# Patient Record
Sex: Male | Born: 1935
Health system: Southern US, Community
[De-identification: ages and names within clinical notes are randomized; demographics above are authoritative.]

## PROBLEM LIST (undated history)

## (undated) DIAGNOSIS — E785 Hyperlipidemia, unspecified: Secondary | ICD-10-CM

## (undated) DIAGNOSIS — H409 Unspecified glaucoma: Secondary | ICD-10-CM

## (undated) DIAGNOSIS — I10 Essential (primary) hypertension: Secondary | ICD-10-CM

## (undated) DIAGNOSIS — C801 Malignant (primary) neoplasm, unspecified: Secondary | ICD-10-CM

## (undated) HISTORY — PX: CATARACT EXTRACTION: SUR2

## (undated) HISTORY — DX: Unspecified glaucoma: H40.9

## (undated) HISTORY — PX: COLONOSCOPY: SHX174

## (undated) HISTORY — DX: Malignant (primary) neoplasm, unspecified: C80.1

## (undated) HISTORY — DX: Hyperlipidemia, unspecified: E78.5

---

## 1994-01-19 HISTORY — PX: PROSTATE SURGERY: SHX751

## 2003-08-31 ENCOUNTER — Other Ambulatory Visit: Payer: Self-pay

## 2005-05-21 ENCOUNTER — Ambulatory Visit: Payer: Self-pay | Admitting: Ophthalmology

## 2005-05-27 ENCOUNTER — Ambulatory Visit: Payer: Self-pay | Admitting: Ophthalmology

## 2005-12-14 ENCOUNTER — Ambulatory Visit: Payer: Self-pay | Admitting: General Surgery

## 2007-01-20 HISTORY — PX: COLON RESECTION: SHX5231

## 2008-09-14 ENCOUNTER — Ambulatory Visit: Payer: Self-pay | Admitting: General Surgery

## 2008-09-14 LAB — HM COLONOSCOPY: HM Colonoscopy: NORMAL

## 2011-07-06 ENCOUNTER — Ambulatory Visit: Payer: Self-pay | Admitting: Family Medicine

## 2011-08-15 ENCOUNTER — Ambulatory Visit: Payer: Self-pay | Admitting: Family Medicine

## 2011-11-17 ENCOUNTER — Ambulatory Visit: Payer: Self-pay | Admitting: Ophthalmology

## 2011-12-02 ENCOUNTER — Ambulatory Visit: Payer: Self-pay | Admitting: Ophthalmology

## 2012-04-19 ENCOUNTER — Ambulatory Visit: Payer: Self-pay | Admitting: Family Medicine

## 2012-08-02 ENCOUNTER — Encounter: Payer: Self-pay | Admitting: *Deleted

## 2012-10-24 DIAGNOSIS — Z8546 Personal history of malignant neoplasm of prostate: Secondary | ICD-10-CM | POA: Insufficient documentation

## 2013-04-25 LAB — HEPATIC FUNCTION PANEL
ALT: 13 U/L (ref 10–40)
AST: 16 U/L (ref 14–40)
Alkaline Phosphatase: 73 U/L (ref 25–125)
Bilirubin, Total: 0.8 mg/dL

## 2013-04-25 LAB — CBC AND DIFFERENTIAL
HCT: 49 % (ref 41–53)
Hemoglobin: 16.6 g/dL (ref 13.5–17.5)
Neutrophils Absolute: 3 /uL
PLATELETS: 298 10*3/uL (ref 150–399)
WBC: 5.9 10^3/mL

## 2013-04-25 LAB — TSH: TSH: 4.53 u[IU]/mL (ref 0.41–5.90)

## 2013-08-29 DIAGNOSIS — R339 Retention of urine, unspecified: Secondary | ICD-10-CM | POA: Insufficient documentation

## 2013-08-29 DIAGNOSIS — K4090 Unilateral inguinal hernia, without obstruction or gangrene, not specified as recurrent: Secondary | ICD-10-CM | POA: Insufficient documentation

## 2013-08-31 ENCOUNTER — Encounter: Payer: Self-pay | Admitting: General Surgery

## 2013-09-28 ENCOUNTER — Ambulatory Visit (INDEPENDENT_AMBULATORY_CARE_PROVIDER_SITE_OTHER): Payer: Commercial Managed Care - HMO | Admitting: General Surgery

## 2013-09-28 ENCOUNTER — Encounter: Payer: Self-pay | Admitting: General Surgery

## 2013-09-28 VITALS — BP 120/80 | HR 72 | Resp 14 | Ht 72.0 in | Wt 168.0 lb

## 2013-09-28 DIAGNOSIS — Z860100 Personal history of colon polyps, unspecified: Secondary | ICD-10-CM | POA: Insufficient documentation

## 2013-09-28 MED ORDER — POLYETHYLENE GLYCOL 3350 17 GM/SCOOP PO POWD: ORAL | Status: DC

## 2013-09-28 NOTE — Patient Instructions (Addendum)
Colonoscopy A colonoscopy is an exam to look at the entire large intestine (colon). This exam can help find problems such as tumors, polyps, inflammation, and areas of bleeding. The exam takes about 1 hour.  LET [facility] CARE PROVIDER KNOW ABOUT:   Any allergies you have.  All medicines you are taking, including vitamins, herbs, eye drops, creams, and over-the-counter medicines.  Previous problems you or members of your family have had with the use of anesthetics.  Any blood disorders you have.  Previous surgeries you have had.  Medical conditions you have. RISKS AND COMPLICATIONS  Generally, this is a safe procedure. However, as with any procedure, complications can occur. Possible complications include:  Bleeding.  Tearing or rupture of the colon wall.  Reaction to medicines given during the exam.  Infection (rare). BEFORE THE PROCEDURE   Ask your health care provider about changing or stopping your regular medicines.  You may be prescribed an oral bowel prep. This involves drinking a large amount of medicated liquid, starting the day before your procedure. The liquid will cause you to have multiple loose stools until your stool is almost clear or light green. This cleans out your colon in preparation for the procedure.  Do not eat or drink anything else once you have started the bowel prep, unless your health care provider tells you it is safe to do so.  Arrange for someone to drive you home after the procedure. PROCEDURE   You will be given medicine to help you relax (sedative).  You will lie on your side with your knees bent.  A long, flexible tube with a light and camera on the end (colonoscope) will be inserted through the rectum and into the colon. The camera sends video back to a computer screen as it moves through the colon. The colonoscope also releases carbon dioxide gas to inflate the colon. This helps your health care provider see the area better.  During  the exam, your health care provider may take a small tissue sample (biopsy) to be examined under a microscope if any abnormalities are found.  The exam is finished when the entire colon has been viewed. AFTER THE PROCEDURE   Do not drive for 24 hours after the exam.  You may have a small amount of blood in your stool.  You may pass moderate amounts of gas and have mild abdominal cramping or bloating. This is caused by the gas used to inflate your colon during the exam.  Ask when your test results will be ready and how you will get your results. Make sure you get your test results. Document Released: 01/03/2000 Document Revised: 10/26/2012 Document Reviewed: 09/12/2012 [facility] Patient Information 2015 [place], [place]. This information is not intended to replace advice given to you by your health care provider. Make sure you discuss any questions you have with your health care provider.  Patient has been scheduled for a colonoscopy on 10-11-13 at [facility].

## 2013-09-28 NOTE — Progress Notes (Signed)
Patient ID: Jeffrey Hendrix., male   DOB: 06/14/1924, 80 y.o.   MRN: [id number]  Chief Complaint  Patient presents with  . Colonoscopy    HPI Jeffrey Hendrix. is a 80 y.o. male.  Here today to discuss having a colonoscopy. Denies any gastrointestinal issues. Last colonoscopy was 09-14-08.In 2009 he had a right hemicolectomy for a large villous adenoma   HPI  Past Medical History  Diagnosis Date  . Hyperlipidemia   . Glaucoma   . Cancer     PROSTATE    Past Surgical History  Procedure Laterality Date  . Colon resection  2009  . Prostate surgery  1996  . Cataract extraction  2012, 2013  . Colonoscopy  09-14-08    Dr Jamal Collin    History reviewed. No pertinent family history.  Social History History  Substance Use Topics  . Smoking status: Never Smoker   . Smokeless tobacco: Never Used  . Alcohol Use: No    Allergies  Allergen Reactions  . Latex     Current Outpatient Prescriptions  Medication Sig Dispense Refill  . dorzolamide-timolol (COSOPT) 22.3-6.8 MG/ML ophthalmic solution 1 drop 2 (two) times daily.      Marland Kitchen levothyroxine (SYNTHROID, LEVOTHROID) 50 MCG tablet Take 50 mcg by mouth daily before breakfast.      . simvastatin (ZOCOR) 10 MG tablet Take 10 mg by mouth daily at 6 PM.       . polyethylene glycol powder (GLYCOLAX/MIRALAX) powder 255 grams one bottle for colonoscopy prep  255 g  0   No current facility-administered medications for this visit.    Review of Systems Review of Systems  Constitutional: Negative.   Respiratory: Negative.   Cardiovascular: Negative.   Gastrointestinal: Negative for diarrhea, constipation and blood in stool.    Blood pressure 120/80, pulse 72, resp. rate 14, height 6' (1.829 m), weight 168 lb (76.204 kg).  Physical Exam Physical Exam  Constitutional: He is oriented to person, place, and time. He appears well-developed and well-nourished.  Eyes: Conjunctivae are normal. No scleral icterus.  Neck: Neck supple.   Cardiovascular: Normal rate, regular rhythm and normal heart sounds.   Pulmonary/Chest: Effort normal and breath sounds normal.  Abdominal: Soft. Normal appearance and bowel sounds are normal. There is no hepatomegaly. There is no tenderness. No hernia.    Neurological: He is alert and oriented to person, place, and time.  Skin: Skin is warm and dry.    Data Reviewed Prior notes  Assessment    History of colon polyps. Right groin nodule-can be followed    Plan    Discussed colonoscopy for surveillance. Patient agreeable.     Patient has been scheduled for a colonoscopy on 10-11-13 at [facility]   SANKAR,SEEPLAPUTHUR G 09/28/2013, 1:51 PM

## 2013-10-06 ENCOUNTER — Other Ambulatory Visit: Payer: Self-pay | Admitting: General Surgery

## 2013-10-06 DIAGNOSIS — Z86010 Personal history of colonic polyps: Secondary | ICD-10-CM

## 2013-10-11 ENCOUNTER — Ambulatory Visit: Payer: Self-pay | Admitting: General Surgery

## 2013-10-11 DIAGNOSIS — K620 Anal polyp: Secondary | ICD-10-CM

## 2013-10-11 DIAGNOSIS — K621 Rectal polyp: Secondary | ICD-10-CM

## 2013-10-11 DIAGNOSIS — Z86010 Personal history of colonic polyps: Secondary | ICD-10-CM

## 2013-10-12 ENCOUNTER — Encounter: Payer: Self-pay | Admitting: General Surgery

## 2013-10-13 LAB — PATHOLOGY REPORT

## 2013-10-16 ENCOUNTER — Encounter: Payer: Self-pay | Admitting: General Surgery

## 2013-11-09 LAB — LIPID PANEL
CHOLESTEROL: 160 mg/dL (ref 0–200)
HDL: 54 mg/dL (ref 35–70)
LDL CALC: 77 mg/dL
LDL/HDL RATIO: 1.4
TRIGLYCERIDES: 146 mg/dL (ref 40–160)

## 2014-02-14 ENCOUNTER — Encounter: Payer: Self-pay | Admitting: General Surgery

## 2014-02-14 ENCOUNTER — Ambulatory Visit (INDEPENDENT_AMBULATORY_CARE_PROVIDER_SITE_OTHER): Payer: Commercial Managed Care - HMO | Admitting: General Surgery

## 2014-02-14 VITALS — BP 164/88 | HR 82 | Resp 14 | Ht 72.0 in | Wt 172.0 lb

## 2014-02-14 DIAGNOSIS — R109 Unspecified abdominal pain: Secondary | ICD-10-CM | POA: Diagnosis not present

## 2014-02-14 NOTE — Progress Notes (Signed)
Patient ID: Jeffrey Eve., male   DOB: March 31, 1924, 81 y.o.   MRN: [id number]  Chief Complaint  Patient presents with  . Follow-up    left side ache    HPI Jeffrey Hendrix. is a 82 y.o. male here today for a evaluation of left side abdominal pain.He states the pain is a mild discomfort and occurs at night when lying down. He does not have any abdominal symptoms during wake ours.  Patient states he had a colonoscopy in 09/2013 and a rectal polyp was removed.   HPI  Past Medical History  Diagnosis Date  . Hyperlipidemia   . Glaucoma   . Cancer     PROSTATE    Past Surgical History  Procedure Laterality Date  . Colon resection  2009  . Prostate surgery  1996  . Cataract extraction  2012, 2013  . Colonoscopy  09-14-08,09/2013.    Dr Jamal Collin    History reviewed. No pertinent family history.  Social History History  Substance Use Topics  . Smoking status: Never Smoker   . Smokeless tobacco: Never Used  . Alcohol Use: No    Allergies  Allergen Reactions  . Latex     Current Outpatient Prescriptions  Medication Sig Dispense Refill  . dorzolamide-timolol (COSOPT) 22.3-6.8 MG/ML ophthalmic solution 1 drop 2 (two) times daily.    Marland Kitchen levothyroxine (SYNTHROID, LEVOTHROID) 50 MCG tablet Take 50 mcg by mouth daily before breakfast.    . polyethylene glycol powder (GLYCOLAX/MIRALAX) powder 255 grams one bottle for colonoscopy prep 255 g 0  . simvastatin (ZOCOR) 10 MG tablet Take 10 mg by mouth daily at 6 PM.      No current facility-administered medications for this visit.    Review of Systems Review of Systems  Blood pressure 164/88, pulse 82, resp. rate 14, height 6' (1.829 m), weight 172 lb (78.019 kg).  Physical Exam Physical Exam  Constitutional: He is oriented to person, place, and time. He appears well-developed and well-nourished.  Abdominal: Soft. Normal appearance and bowel sounds are normal. There is no hepatomegaly. There is no tenderness. No  hernia.  He still has a small firm nodular area in right spermatic cord below level of external ring. This is not a hernia.  Neurological: He is alert and oriented to person, place, and time.  Skin: Skin is warm and dry.    Data Reviewed None Assessment    Abd discomfort is likely not from GI source. If it worsens will evaluate  With CT.    Plan    Patient in recall for his Flex Sig in March 2016.        Jeffrey Hendrix G 02/14/2014, 2:21 PM

## 2014-02-14 NOTE — Patient Instructions (Signed)
  Patient in recall for his Flex Sig in March 2016.

## 2014-03-05 DIAGNOSIS — N50819 Testicular pain, unspecified: Secondary | ICD-10-CM | POA: Insufficient documentation

## 2014-03-07 DIAGNOSIS — L820 Inflamed seborrheic keratosis: Secondary | ICD-10-CM | POA: Diagnosis not present

## 2014-03-07 DIAGNOSIS — L821 Other seborrheic keratosis: Secondary | ICD-10-CM | POA: Diagnosis not present

## 2014-03-07 DIAGNOSIS — L570 Actinic keratosis: Secondary | ICD-10-CM | POA: Diagnosis not present

## 2014-03-07 DIAGNOSIS — L538 Other specified erythematous conditions: Secondary | ICD-10-CM | POA: Diagnosis not present

## 2014-03-07 DIAGNOSIS — Z85828 Personal history of other malignant neoplasm of skin: Secondary | ICD-10-CM | POA: Diagnosis not present

## 2014-03-07 DIAGNOSIS — X32XXXA Exposure to sunlight, initial encounter: Secondary | ICD-10-CM | POA: Diagnosis not present

## 2014-04-11 ENCOUNTER — Ambulatory Visit (INDEPENDENT_AMBULATORY_CARE_PROVIDER_SITE_OTHER): Payer: Commercial Managed Care - HMO | Admitting: General Surgery

## 2014-04-11 ENCOUNTER — Encounter: Payer: Self-pay | Admitting: General Surgery

## 2014-04-11 VITALS — BP 140/70 | HR 78 | Resp 12 | Ht 72.0 in | Wt 168.0 lb

## 2014-04-11 DIAGNOSIS — D012 Carcinoma in situ of rectum: Secondary | ICD-10-CM

## 2014-04-11 NOTE — Patient Instructions (Signed)
Patient will follow up in September for colonoscopy.

## 2014-04-11 NOTE — Progress Notes (Signed)
Here today for 6 month follow up rigid sigmoidoscopy. No bowel issues and no blood noted.   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  Patient was placed in left lateral position.  Rigid sigmoidoscope was introduced into the rectum and advanced at 20 cm level. There was good visualization and no polyps or other abnormalities noted. Plan for colonoscopy in September which will be 1 year from the initial study.

## 2014-04-19 DIAGNOSIS — N508 Other specified disorders of male genital organs: Secondary | ICD-10-CM | POA: Diagnosis not present

## 2014-04-19 DIAGNOSIS — H409 Unspecified glaucoma: Secondary | ICD-10-CM | POA: Insufficient documentation

## 2014-04-19 DIAGNOSIS — K4090 Unilateral inguinal hernia, without obstruction or gangrene, not specified as recurrent: Secondary | ICD-10-CM | POA: Diagnosis not present

## 2014-04-19 DIAGNOSIS — Z8546 Personal history of malignant neoplasm of prostate: Secondary | ICD-10-CM | POA: Diagnosis not present

## 2014-04-25 ENCOUNTER — Emergency Department
Admit: 2014-04-25 | Disposition: A | Discharge status: Home / Self Care | Payer: Self-pay | Admitting: Emergency Medicine

## 2014-04-25 DIAGNOSIS — I10 Essential (primary) hypertension: Secondary | ICD-10-CM | POA: Diagnosis not present

## 2014-04-25 DIAGNOSIS — R0789 Other chest pain: Secondary | ICD-10-CM | POA: Diagnosis not present

## 2014-04-25 LAB — CBC
HCT: 50.2 % (ref 40.0–52.0)
HGB: 16.6 g/dL (ref 13.0–18.0)
MCH: 30.4 pg (ref 26.0–34.0)
MCHC: 33 g/dL (ref 32.0–36.0)
MCV: 92 fL (ref 80–100)
Platelet: 269 10*3/uL (ref 150–440)
RBC: 5.46 10*6/uL (ref 4.40–5.90)
RDW: 14.1 % (ref 11.5–14.5)
WBC: 7 10*3/uL (ref 3.8–10.6)

## 2014-04-25 LAB — BASIC METABOLIC PANEL
Anion Gap: 6 — ABNORMAL LOW (ref 7–16)
BUN: 16 mg/dL
Calcium, Total: 9.5 mg/dL
Chloride: 103 mmol/L
Co2: 29 mmol/L
Creatinine: 1.03 mg/dL
EGFR (Non-African Amer.): >60
Glucose: 96 mg/dL
Potassium: 4 mmol/L
Sodium: 138 mmol/L

## 2014-04-25 LAB — TROPONIN I

## 2014-04-26 DIAGNOSIS — R0789 Other chest pain: Secondary | ICD-10-CM | POA: Diagnosis not present

## 2014-04-26 DIAGNOSIS — I10 Essential (primary) hypertension: Secondary | ICD-10-CM | POA: Diagnosis not present

## 2014-04-30 DIAGNOSIS — C61 Malignant neoplasm of prostate: Secondary | ICD-10-CM | POA: Diagnosis not present

## 2014-04-30 DIAGNOSIS — Z23 Encounter for immunization: Secondary | ICD-10-CM | POA: Diagnosis not present

## 2014-04-30 DIAGNOSIS — E039 Hypothyroidism, unspecified: Secondary | ICD-10-CM | POA: Diagnosis not present

## 2014-04-30 DIAGNOSIS — R5381 Other malaise: Secondary | ICD-10-CM | POA: Diagnosis not present

## 2014-04-30 DIAGNOSIS — I10 Essential (primary) hypertension: Secondary | ICD-10-CM | POA: Diagnosis not present

## 2014-05-07 DIAGNOSIS — Z23 Encounter for immunization: Secondary | ICD-10-CM | POA: Diagnosis not present

## 2014-05-07 DIAGNOSIS — C61 Malignant neoplasm of prostate: Secondary | ICD-10-CM | POA: Diagnosis not present

## 2014-05-07 DIAGNOSIS — I10 Essential (primary) hypertension: Secondary | ICD-10-CM | POA: Diagnosis not present

## 2014-05-07 DIAGNOSIS — E039 Hypothyroidism, unspecified: Secondary | ICD-10-CM | POA: Diagnosis not present

## 2014-05-07 DIAGNOSIS — R5381 Other malaise: Secondary | ICD-10-CM | POA: Diagnosis not present

## 2014-05-08 DIAGNOSIS — H4011X3 Primary open-angle glaucoma, severe stage: Secondary | ICD-10-CM | POA: Diagnosis not present

## 2014-05-08 NOTE — Op Note (Signed)
PATIENT NAME:  Jeffrey Hendrix, Jeffrey Hendrix MR#:  [id number] DATE OF BIRTH:  Jan 11, 1925  DATE OF PROCEDURE:  12/02/2011  PREOPERATIVE DIAGNOSIS:  Senile cataract right eye.  POSTOPERATIVE DIAGNOSIS:  Senile cataract right eye.  PROCEDURE:  Phacoemulsification with posterior chamber intraocular lens implantation of the right eye.  LENS:  ZCB00 19-diopter posterior chamber intraocular lens.  ULTRASOUND TIME:  16 % of 1 minute, 19 seconds. CDE 12.2.  SURGEON:  Mali Tamie Minteer, MD  ANESTHESIA:  Topical with tetracaine drops and 2% Xylocaine jelly.  COMPLICATIONS:  None.  DESCRIPTION OF PROCEDURE:  The patient was identified in the holding room and transported to the operating room and placed in the supine position under the operating microscope.  The right eye was identified as the operative eye and it was prepped and draped in the usual sterile ophthalmic fashion.  A 1 millimeter clear-corneal paracentesis was made at the 12:00 position.  The anterior chamber was filled with Viscoat viscoelastic.  A 2.4 millimeter keratome was used to make a near-clear corneal incision at the 9:00 position.  A curvilinear capsulorrhexis was made with a cystotome and capsulorrhexis forceps.  Balanced salt solution was used to hydrodissect and hydrodelineate the nucleus.  Phacoemulsification was then used in horizontal chopping fashion to remove the lens nucleus and epinucleus.  The remaining cortex was then removed using the irrigation and aspiration handpiece. Provisc was then placed into the capsular bag to distend it for lens placement.  A ZCB00 19-diopter lens was then injected into the capsular bag.  The remaining viscoelastic was aspirated.  Wounds were hydrated with balanced salt solution.  The anterior chamber was inflated to a physiologic pressure with balanced salt solution.  Miostat was placed into the anterior chamber to constrict the pupil.  No wound leaks were noted.  Topical Vigamox drops and Maxitrol ointment  were applied to the eye. The patient was taken to the recovery room in stable condition without complications of anesthesia or surgery.   ____________________________ Wyonia Hough, MD crb:bjt D: 12/02/2011 15:07:45 ET T: 12/02/2011 15:57:48 ET JOB#: [id number]  cc: Wyonia Hough, MD, <Dictator> Leandrew Koyanagi MD ELECTRONICALLY SIGNED 12/08/2011 12:23

## 2014-06-04 DIAGNOSIS — E039 Hypothyroidism, unspecified: Secondary | ICD-10-CM | POA: Diagnosis not present

## 2014-06-04 DIAGNOSIS — Z23 Encounter for immunization: Secondary | ICD-10-CM | POA: Diagnosis not present

## 2014-06-04 DIAGNOSIS — I10 Essential (primary) hypertension: Secondary | ICD-10-CM | POA: Diagnosis not present

## 2014-06-04 DIAGNOSIS — C61 Malignant neoplasm of prostate: Secondary | ICD-10-CM | POA: Diagnosis not present

## 2014-06-04 DIAGNOSIS — E780 Pure hypercholesterolemia: Secondary | ICD-10-CM | POA: Diagnosis not present

## 2014-06-04 LAB — BASIC METABOLIC PANEL
BUN: 14 mg/dL (ref 4–21)
Creatinine: 1.1 mg/dL (ref 0.6–1.3)
Glucose: 96 mg/dL
Potassium: 4.6 mmol/L (ref 3.4–5.3)
Sodium: 141 mmol/L (ref 137–147)

## 2014-09-12 DIAGNOSIS — X32XXXA Exposure to sunlight, initial encounter: Secondary | ICD-10-CM | POA: Diagnosis not present

## 2014-09-12 DIAGNOSIS — D485 Neoplasm of uncertain behavior of skin: Secondary | ICD-10-CM | POA: Diagnosis not present

## 2014-09-12 DIAGNOSIS — D2372 Other benign neoplasm of skin of left lower limb, including hip: Secondary | ICD-10-CM | POA: Diagnosis not present

## 2014-09-12 DIAGNOSIS — R58 Hemorrhage, not elsewhere classified: Secondary | ICD-10-CM | POA: Diagnosis not present

## 2014-09-12 DIAGNOSIS — L570 Actinic keratosis: Secondary | ICD-10-CM | POA: Diagnosis not present

## 2014-09-12 DIAGNOSIS — L821 Other seborrheic keratosis: Secondary | ICD-10-CM | POA: Diagnosis not present

## 2014-10-16 ENCOUNTER — Encounter: Payer: Self-pay | Admitting: General Surgery

## 2014-10-16 ENCOUNTER — Ambulatory Visit (INDEPENDENT_AMBULATORY_CARE_PROVIDER_SITE_OTHER): Payer: Commercial Managed Care - HMO | Admitting: General Surgery

## 2014-10-16 VITALS — BP 122/70 | HR 74 | Resp 14 | Ht 72.0 in | Wt 171.0 lb

## 2014-10-16 DIAGNOSIS — Z86010 Personal history of colonic polyps: Secondary | ICD-10-CM | POA: Diagnosis not present

## 2014-10-16 DIAGNOSIS — D012 Carcinoma in situ of rectum: Secondary | ICD-10-CM | POA: Diagnosis not present

## 2014-10-16 MED ORDER — POLYETHYLENE GLYCOL 3350 17 GM/SCOOP PO POWD: 1.0000 | Freq: Once | ORAL | Status: DC

## 2014-10-16 NOTE — Patient Instructions (Addendum)
Colonoscopy A colonoscopy is an exam to look at the entire large intestine (colon). This exam can help find problems such as tumors, polyps, inflammation, and areas of bleeding. The exam takes about 1 hour.  LET [facility] CARE PROVIDER KNOW ABOUT:   Any allergies you have.  All medicines you are taking, including vitamins, herbs, eye drops, creams, and over-the-counter medicines.  Previous problems you or members of your family have had with the use of anesthetics.  Any blood disorders you have.  Previous surgeries you have had.  Medical conditions you have. RISKS AND COMPLICATIONS  Generally, this is a safe procedure. However, as with any procedure, complications can occur. Possible complications include:  Bleeding.  Tearing or rupture of the colon wall.  Reaction to medicines given during the exam.  Infection (rare). BEFORE THE PROCEDURE   Ask your health care provider about changing or stopping your regular medicines.  You may be prescribed an oral bowel prep. This involves drinking a large amount of medicated liquid, starting the day before your procedure. The liquid will cause you to have multiple loose stools until your stool is almost clear or light green. This cleans out your colon in preparation for the procedure.  Do not eat or drink anything else once you have started the bowel prep, unless your health care provider tells you it is safe to do so.  Arrange for someone to drive you home after the procedure. PROCEDURE   You will be given medicine to help you relax (sedative).  You will lie on your side with your knees bent.  A long, flexible tube with a light and camera on the end (colonoscope) will be inserted through the rectum and into the colon. The camera sends video back to a computer screen as it moves through the colon. The colonoscope also releases carbon dioxide gas to inflate the colon. This helps your health care provider see the area better.  During  the exam, your health care provider may take a small tissue sample (biopsy) to be examined under a microscope if any abnormalities are found.  The exam is finished when the entire colon has been viewed. AFTER THE PROCEDURE   Do not drive for 24 hours after the exam.  You may have a small amount of blood in your stool.  You may pass moderate amounts of gas and have mild abdominal cramping or bloating. This is caused by the gas used to inflate your colon during the exam.  Ask when your test results will be ready and how you will get your results. Make sure you get your test results. Document Released: 01/03/2000 Document Revised: 10/26/2012 Document Reviewed: 09/12/2012 [facility] Patient Information 2015 [place], [place]. This information is not intended to replace advice given to you by your health care provider. Make sure you discuss any questions you have with your health care provider.  Patient is scheduled for a colonoscopy at [facility] on 10/31/14. He is aware to call the day before for his arrival time. Miralax prescription has been sent into his pharmacy. The patient is aware of date and instructions.

## 2014-10-16 NOTE — Progress Notes (Signed)
Patient ID: Jeffrey Eve., male   DOB: Dec 08, 1924, 81 y.o.   MRN: [id number]  Chief Complaint  Patient presents with  . Follow-up    rectal cancer    HPI Jeffrey Hendrix. is a 81 y.o. male here today follow up rectal cancer -intramucosal carcinoma in a rectal polyp removed 1 year ago via colonoscope. In 2009 he had a right hemicolonectomy for a large villous adenoma. Patient states he is doing well. He reports a "sick or icky feeling" in his abdomen for the past 6-8 weeks. Recently it has been more focused in the LLQ. It did ease off a bit the past 2-3 days. He takes a thyroid pill in the morning. He had tried drinking lactose-free milk but 2 days ago resumed drinking 2% milk.    HPI  Past Medical History  Diagnosis Date  . Hyperlipidemia   . Glaucoma   . Cancer     PROSTATE    Past Surgical History  Procedure Laterality Date  . Colon resection  2009  . Prostate surgery  1996  . Cataract extraction  2012, 2013  . Colonoscopy  09-14-08,09/2013.    Dr Jamal Collin    History reviewed. No pertinent family history.  Social History Social History  Substance Use Topics  . Smoking status: Never Smoker   . Smokeless tobacco: Never Used  . Alcohol Use: No    Allergies  Allergen Reactions  . Latex     Current Outpatient Prescriptions  Medication Sig Dispense Refill  . hydrochlorothiazide (MICROZIDE) 12.5 MG capsule Take 12.5 mg by mouth daily.    Marland Kitchen levothyroxine (SYNTHROID, LEVOTHROID) 50 MCG tablet Take 50 mcg by mouth daily before breakfast.    . simvastatin (ZOCOR) 10 MG tablet Take 10 mg by mouth daily at 6 PM.     . timolol (TIMOPTIC) 0.5 % ophthalmic solution     . polyethylene glycol powder (GLYCOLAX/MIRALAX) powder Take 255 g by mouth once. 255 g 0   No current facility-administered medications for this visit.    Review of Systems Review of Systems  Constitutional: Negative.   Respiratory: Negative.   Cardiovascular: Negative.     Blood pressure  122/70, pulse 74, resp. rate 14, height 6' (1.829 m), weight 171 lb (77.565 kg).  Physical Exam Physical Exam  Constitutional: He is oriented to person, place, and time. He appears well-developed and well-nourished.  Eyes: Conjunctivae are normal. No scleral icterus.  Neck: Neck supple.  Cardiovascular: Normal rate, regular rhythm and normal heart sounds.   Pulmonary/Chest: Effort normal and breath sounds normal.  Abdominal: Soft. Normal appearance and bowel sounds are normal. There is no hepatomegaly. There is no tenderness.  Lymphadenopathy:    He has no cervical adenopathy.  Neurological: He is alert and oriented to person, place, and time.  Skin: Skin is warm and dry.    Data Reviewed Notes reviewed.  Assessment    History of colonic polyp with intramucosal cancer Abdominal discomfort due to diet?, improving    Plan   Surveillance colonoscopy as planned. Colonoscopy with possible biopsy/polypectomy prn: Information regarding the procedure, including its potential risks and complications (including but not limited to perforation of the bowel, which may require emergency surgery to repair, and bleeding) was verbally given to the patient. Educational information regarding lower intestinal endoscopy was given to the patient. Written instructions for how to complete the bowel prep using Miralax were provided. The importance of drinking ample fluids to avoid dehydration as a result of  the prep emphasized.  Patient is scheduled for a colonoscopy at [facility] on 10/31/14. He is aware to call the day before for his arrival time. Miralax prescription has been sent into his pharmacy. The patient is aware of date and instructions.      PCP:  Cranford Mon, Richard Barkley Bruns G 10/16/2014, 10:11 AM

## 2014-10-24 DIAGNOSIS — H401133 Primary open-angle glaucoma, bilateral, severe stage: Secondary | ICD-10-CM | POA: Diagnosis not present

## 2014-10-29 DIAGNOSIS — H401133 Primary open-angle glaucoma, bilateral, severe stage: Secondary | ICD-10-CM | POA: Diagnosis not present

## 2014-10-31 ENCOUNTER — Ambulatory Visit: Payer: Commercial Managed Care - HMO | Admitting: Anesthesiology

## 2014-10-31 ENCOUNTER — Ambulatory Visit
Admission: RE | Admit: 2014-10-31 | Discharge: 2014-10-31 | Disposition: A | Discharge status: Home / Self Care | Payer: Commercial Managed Care - HMO | Source: Ambulatory Visit | Attending: General Surgery | Admitting: General Surgery

## 2014-10-31 ENCOUNTER — Encounter
Admission: RE | Disposition: A | Discharge status: Home / Self Care | Payer: Self-pay | Source: Ambulatory Visit | Attending: General Surgery

## 2014-10-31 ENCOUNTER — Encounter: Payer: Self-pay | Admitting: *Deleted

## 2014-10-31 DIAGNOSIS — Z91040 Latex allergy status: Secondary | ICD-10-CM | POA: Diagnosis not present

## 2014-10-31 DIAGNOSIS — E785 Hyperlipidemia, unspecified: Secondary | ICD-10-CM | POA: Insufficient documentation

## 2014-10-31 DIAGNOSIS — Z9849 Cataract extraction status, unspecified eye: Secondary | ICD-10-CM | POA: Insufficient documentation

## 2014-10-31 DIAGNOSIS — K5790 Diverticulosis of intestine, part unspecified, without perforation or abscess without bleeding: Secondary | ICD-10-CM | POA: Diagnosis not present

## 2014-10-31 DIAGNOSIS — Z86010 Personal history of colonic polyps: Secondary | ICD-10-CM | POA: Insufficient documentation

## 2014-10-31 DIAGNOSIS — Z85038 Personal history of other malignant neoplasm of large intestine: Secondary | ICD-10-CM | POA: Diagnosis not present

## 2014-10-31 DIAGNOSIS — Z9049 Acquired absence of other specified parts of digestive tract: Secondary | ICD-10-CM | POA: Insufficient documentation

## 2014-10-31 DIAGNOSIS — Z09 Encounter for follow-up examination after completed treatment for conditions other than malignant neoplasm: Secondary | ICD-10-CM | POA: Diagnosis not present

## 2014-10-31 DIAGNOSIS — Z8546 Personal history of malignant neoplasm of prostate: Secondary | ICD-10-CM | POA: Diagnosis not present

## 2014-10-31 DIAGNOSIS — H409 Unspecified glaucoma: Secondary | ICD-10-CM | POA: Diagnosis not present

## 2014-10-31 DIAGNOSIS — Z79899 Other long term (current) drug therapy: Secondary | ICD-10-CM | POA: Insufficient documentation

## 2014-10-31 HISTORY — PX: COLONOSCOPY WITH PROPOFOL: SHX5780

## 2014-10-31 SURGERY — COLONOSCOPY WITH PROPOFOL
Anesthesia: General

## 2014-10-31 MED ORDER — PROPOFOL 500 MG/50ML IV EMUL
INTRAVENOUS | Status: DC | PRN
  Administered 2014-10-31: 125 ug/kg/min via INTRAVENOUS

## 2014-10-31 MED ORDER — PROPOFOL 10 MG/ML IV BOLUS
INTRAVENOUS | Status: DC | PRN
  Administered 2014-10-31: 40 mg via INTRAVENOUS

## 2014-10-31 MED ORDER — LIDOCAINE HCL (CARDIAC) 20 MG/ML IV SOLN
INTRAVENOUS | Status: DC | PRN
  Administered 2014-10-31: 60 mg via INTRAVENOUS

## 2014-10-31 MED ORDER — SODIUM CHLORIDE 0.9 % IV SOLN
INTRAVENOUS | Status: DC
  Administered 2014-10-31: 1000 mL via INTRAVENOUS

## 2014-10-31 NOTE — Anesthesia Preprocedure Evaluation (Signed)
Anesthesia Evaluation  Patient identified by MRN, date of birth, ID band Patient awake    Reviewed: Allergy & Precautions, H&P , NPO status , Patient's Chart, lab work & pertinent test results  History of Anesthesia Complications Negative for: history of anesthetic complications  Airway Mallampati: II  TM Distance: >3 FB Neck ROM: limited    Dental no notable dental hx. (+) Teeth Intact   Pulmonary neg pulmonary ROS, neg shortness of breath,    Pulmonary exam normal breath sounds clear to auscultation       Cardiovascular Exercise Tolerance: Good (-) angina(-) Past MI and (-) DOE negative cardio ROS Normal cardiovascular exam Rhythm:regular Rate:Normal     Neuro/Psych negative neurological ROS  negative psych ROS   GI/Hepatic negative GI ROS, Neg liver ROS,   Endo/Other  negative endocrine ROS  Renal/GU negative Renal ROS  negative genitourinary   Musculoskeletal   Abdominal   Peds  Hematology negative hematology ROS (+)   Anesthesia Other Findings Past Medical History:   Hyperlipidemia                                               Glaucoma                                                     Cancer (HCC)                                                   Comment:PROSTATE  BMI    Body Mass Index   23.59 kg/m 2      Reproductive/Obstetrics negative OB ROS                             Anesthesia Physical Anesthesia Plan  ASA: II  Anesthesia Plan: General   Post-op Pain Management:    Induction:   Airway Management Planned:   Additional Equipment:   Intra-op Plan:   Post-operative Plan:   Informed Consent: I have reviewed the patients History and Physical, chart, labs and discussed the procedure including the risks, benefits and alternatives for the proposed anesthesia with the patient or authorized representative who has indicated his/her understanding and acceptance.    Dental Advisory Given  Plan Discussed with: Anesthesiologist, CRNA and Surgeon  Anesthesia Plan Comments:         Anesthesia Quick Evaluation

## 2014-10-31 NOTE — H&P (View-Only) (Signed)
Patient ID: Jeffrey Eve., male   DOB: [birth year], 81 y.o.   MRN: [id number]  Chief Complaint  Patient presents with  . Follow-up    rectal cancer    HPI Jeffrey Hendrix. is a 81 y.o. male here today follow up rectal cancer -intramucosal carcinoma in a rectal polyp removed 1 year ago via colonoscope. In 2009 he had a right hemicolonectomy for a large villous adenoma. Patient states he is doing well. He reports a "sick or icky feeling" in his abdomen for the past 6-8 weeks. Recently it has been more focused in the LLQ. It did ease off a bit the past 2-3 days. He takes a thyroid pill in the morning. He had tried drinking lactose-free milk but 2 days ago resumed drinking 2% milk.    HPI  Past Medical History  Diagnosis Date  . Hyperlipidemia   . Glaucoma   . Cancer     PROSTATE    Past Surgical History  Procedure Laterality Date  . Colon resection  2009  . Prostate surgery  1996  . Cataract extraction  2012, 2013  . Colonoscopy  09-14-08,09/2013.    Dr Jamal Collin    History reviewed. No pertinent family history.  Social History Social History  Substance Use Topics  . Smoking status: Never Smoker   . Smokeless tobacco: Never Used  . Alcohol Use: No    Allergies  Allergen Reactions  . Latex     Current Outpatient Prescriptions  Medication Sig Dispense Refill  . hydrochlorothiazide (MICROZIDE) 12.5 MG capsule Take 12.5 mg by mouth daily.    Marland Kitchen levothyroxine (SYNTHROID, LEVOTHROID) 50 MCG tablet Take 50 mcg by mouth daily before breakfast.    . simvastatin (ZOCOR) 10 MG tablet Take 10 mg by mouth daily at 6 PM.     . timolol (TIMOPTIC) 0.5 % ophthalmic solution     . polyethylene glycol powder (GLYCOLAX/MIRALAX) powder Take 255 g by mouth once. 255 g 0   No current facility-administered medications for this visit.    Review of Systems Review of Systems  Constitutional: Negative.   Respiratory: Negative.   Cardiovascular: Negative.     Blood pressure  122/70, pulse 74, resp. rate 14, height 6' (1.829 m), weight 171 lb (77.565 kg).  Physical Exam Physical Exam  Constitutional: He is oriented to person, place, and time. He appears well-developed and well-nourished.  Eyes: Conjunctivae are normal. No scleral icterus.  Neck: Neck supple.  Cardiovascular: Normal rate, regular rhythm and normal heart sounds.   Pulmonary/Chest: Effort normal and breath sounds normal.  Abdominal: Soft. Normal appearance and bowel sounds are normal. There is no hepatomegaly. There is no tenderness.  Lymphadenopathy:    He has no cervical adenopathy.  Neurological: He is alert and oriented to person, place, and time.  Skin: Skin is warm and dry.    Data Reviewed Notes reviewed.  Assessment    History of colonic polyp with intramucosal cancer Abdominal discomfort due to diet?, improving    Plan   Surveillance colonoscopy as planned. Colonoscopy with possible biopsy/polypectomy prn: Information regarding the procedure, including its potential risks and complications (including but not limited to perforation of the bowel, which may require emergency surgery to repair, and bleeding) was verbally given to the patient. Educational information regarding lower intestinal endoscopy was given to the patient. Written instructions for how to complete the bowel prep using Miralax were provided. The importance of drinking ample fluids to avoid dehydration as a result of  the prep emphasized.  Patient is scheduled for a colonoscopy at [facility] on 10/31/14. He is aware to call the day before for his arrival time. Miralax prescription has been sent into his pharmacy. The patient is aware of date and instructions.      PCP:  Cranford Mon, Richard Barkley Bruns G 10/16/2014, 10:11 AM

## 2014-10-31 NOTE — Transfer of Care (Signed)
Immediate Anesthesia Transfer of Care Note  Patient: Jeffrey Hendrix.  Procedure(s) Performed: Procedure(s): COLONOSCOPY WITH PROPOFOL (N/A)  Patient Location: PACU  Anesthesia Type:General  Level of Consciousness: awake  Airway & Oxygen Therapy: Patient Spontanous Breathing and Patient connected to nasal cannula oxygen  Post-op Assessment: Report given to RN and Post -op Vital signs reviewed and stable  Post vital signs: stable  Last Vitals:  Filed Vitals:   10/31/14 1123  BP: 122/65  Pulse: 62  Temp: 35.9 C  Resp: 15    Complications: No apparent anesthesia complications

## 2014-10-31 NOTE — Interval H&P Note (Signed)
History and Physical Interval Note:  10/31/2014 10:50 AM  Jeffrey Hendrix.  has presented today for surgery, with the diagnosis of HX COLON CA  The various methods of treatment have been discussed with the patient and family. After consideration of risks, benefits and other options for treatment, the patient has consented to  Procedure(s): COLONOSCOPY WITH PROPOFOL (N/A) as a surgical intervention .  The patient's history has been reviewed, patient examined, no change in status, stable for surgery.  I have reviewed the patient's chart and labs.  Questions were answered to the patient's satisfaction.     SANKAR,SEEPLAPUTHUR G

## 2014-10-31 NOTE — Op Note (Signed)
[facility] Gastroenterology Patient Name: Jeffrey Hendrix Procedure Date: 10/31/2014 11:04 AM MRN: [id number] Account #: [id number] Date of Birth: [birth year] Admit Type: Outpatient Age: 81 Room: [facility] ENDO ROOM 1 Gender: Male Note Status: Finalized Procedure:         Colonoscopy Indications:       High risk colon cancer surveillance: Personal history of                     colon cancer Providers:         Orlie Pollen, MD Referring MD:      Janine Ores. Rosanna Randy, MD (Referring MD) Medicines:         General Anesthesia Complications:     No immediate complications. Procedure:         Pre-Anesthesia Assessment:                    - Using IV propofol under the supervision of an                     anesthesiologist was determined to be medically necessary                     for this procedure based on review of the patient's                     medical history, medications, and prior anesthesia history.                    After obtaining informed consent, the colonoscope was                     passed under direct vision. Throughout the procedure, the                     patient's blood pressure, pulse, and oxygen saturations                     were monitored continuously. The Colonoscope was                     introduced through the anus and advanced to the the                     ileocolonic anastomosis. The quality of the bowel                     preparation was excellent. Findings:      The perianal and digital rectal examinations were normal.      The entire examined colon appeared normal. Impression:        - The entire examined colon is normal.                    - No specimens collected. Recommendation:    - Return to endoscopist as previously scheduled. Procedure Code(s): --- Professional ---                    [phone], Colonoscopy, flexible; diagnostic, including                     collection of specimen(s) by brushing or washing, when          performed (separate procedure) Diagnosis Code(s): --- Professional ---  Z85.038, Personal history of other malignant neoplasm of                     large intestine CPT copyright 2014 American Medical Association. All rights reserved. The codes documented in this report are preliminary and upon coder review may  be revised to meet current compliance requirements. Orlie Pollen, MD 10/31/2014 11:24:15 AM This report has been signed electronically. Number of Addenda: 0 Note Initiated On: 10/31/2014 11:04 AM Scope Withdrawal Time: 0 hours 2 minutes 11 seconds  Total Procedure Duration: 0 hours 10 minutes 3 seconds       [facility]

## 2014-11-01 NOTE — Anesthesia Postprocedure Evaluation (Signed)
  Anesthesia Post-op Note  Patient: Jeffrey Hendrix.  Procedure(s) Performed: Procedure(s): COLONOSCOPY WITH PROPOFOL (N/A)  Anesthesia type:General  Patient location: PACU  Post pain: Pain level controlled  Post assessment: Post-op Vital signs reviewed, Patient's Cardiovascular Status Stable, Respiratory Function Stable, Patent Airway and No signs of Nausea or vomiting  Post vital signs: Reviewed and stable  Last Vitals:  Filed Vitals:   10/31/14 1150  BP: 150/83  Pulse: 79  Temp:   Resp: 17    Level of consciousness: awake, alert  and patient cooperative  Complications: No apparent anesthesia complications

## 2014-11-07 ENCOUNTER — Encounter: Payer: Self-pay | Admitting: General Surgery

## 2014-11-12 ENCOUNTER — Telehealth: Payer: Self-pay | Admitting: Emergency Medicine

## 2014-11-12 ENCOUNTER — Emergency Department: Payer: Commercial Managed Care - HMO

## 2014-11-12 ENCOUNTER — Telehealth: Payer: Self-pay | Admitting: Family Medicine

## 2014-11-12 ENCOUNTER — Emergency Department
Admission: EM | Admit: 2014-11-12 | Discharge: 2014-11-12 | Disposition: A | Discharge status: Home / Self Care | Payer: Commercial Managed Care - HMO | Attending: Emergency Medicine | Admitting: Emergency Medicine

## 2014-11-12 ENCOUNTER — Ambulatory Visit: Payer: Self-pay | Admitting: Family Medicine

## 2014-11-12 ENCOUNTER — Encounter: Payer: Self-pay | Admitting: Emergency Medicine

## 2014-11-12 DIAGNOSIS — Z91040 Latex allergy status: Secondary | ICD-10-CM | POA: Diagnosis not present

## 2014-11-12 DIAGNOSIS — R51 Headache: Secondary | ICD-10-CM | POA: Insufficient documentation

## 2014-11-12 DIAGNOSIS — Z79899 Other long term (current) drug therapy: Secondary | ICD-10-CM | POA: Insufficient documentation

## 2014-11-12 DIAGNOSIS — R519 Headache, unspecified: Secondary | ICD-10-CM

## 2014-11-12 MED ORDER — TRAMADOL HCL 50 MG PO TABS: 50.0000 mg | ORAL_TABLET | Freq: Four times a day (QID) | ORAL | Status: DC | PRN

## 2014-11-12 NOTE — ED Notes (Signed)
AAOx3.  Skin warm and dry.  Moving all extremities equally and strong. Ambulates with easy and steady gait.   

## 2014-11-12 NOTE — Telephone Encounter (Signed)
Pt wife called in stating that pt and herself believe pt had a small stroke on Saturday. He reports that he was driving and his vision became blurry and had a headache and was " out of it". He then had to pull over and let his wife drive. He did not go to the hospital at the time but he has had a headache ever since and the headache was worse this morning. I informed pt wife that he needed to go to the ER. Pt or wife did not want to go to the ER so they said they would go to the urgent care. She wanted to be seen here, but Dr. Rosanna Randy was not here and did not want to see anyone else. Pt wife states that she would take him to the Urgent care.

## 2014-11-12 NOTE — Discharge Instructions (Signed)
General Headache Without Cause A headache is pain or discomfort felt around the head or neck area. There are many causes and types of headaches. In some cases, the cause may not be found.  HOME CARE  Managing Pain  Take over-the-counter and prescription medicines only as told by your doctor.  Lie down in a dark, quiet room when you have a headache.  If directed, apply ice to the head and neck area:  Put ice in a plastic bag.  Place a towel between your skin and the bag.  Leave the ice on for 20 minutes, 2-3 times per day.  Use a heating pad or hot shower to apply heat to the head and neck area as told by your doctor.  Keep lights dim if bright lights bother you or make your headaches worse. Eating and Drinking  Eat meals on a regular schedule.  Lessen how much alcohol you drink.  Lessen how much caffeine you drink, or stop drinking caffeine. General Instructions  Keep all follow-up visits as told by your doctor. This is important.  Keep a journal to find out if certain things bring on headaches. For example, write down:  What you eat and drink.  How much sleep you get.  Any change to your diet or medicines.  Relax by getting a massage or doing other relaxing activities.  Lessen stress.  Sit up straight. Do not tighten (tense) your muscles.  Do not use tobacco products. This includes cigarettes, chewing tobacco, or e-cigarettes. If you need help quitting, ask your doctor.  Exercise regularly as told by your doctor.  Get enough sleep. This often means 7-9 hours of sleep. GET HELP IF:  Your symptoms are not helped by medicine.  You have a headache that feels different than the other headaches.  You feel sick to your stomach (nauseous) or you throw up (vomit).  You have a fever. GET HELP RIGHT AWAY IF:   Your headache becomes really bad.  You keep throwing up.  You have a stiff neck.  You have trouble seeing.  You have trouble speaking.  You have  pain in the eye or ear.  Your muscles are weak or you lose muscle control.  You lose your balance or have trouble walking.  You feel like you will pass out (faint) or you pass out.  You have confusion.   This information is not intended to replace advice given to you by your health care provider. Make sure you discuss any questions you have with your health care provider.   Document Released: 10/15/2007 Document Revised: 09/26/2014 Document Reviewed: 04/30/2014 Elsevier Interactive Patient Education [employer].  Please return immediately if condition worsens. Please contact her primary physician or the physician you were given for referral. If you have any specialist physicians involved in her treatment and plan please also contact them. Thank you for using [place] [facility] emergency [facility].

## 2014-11-12 NOTE — Telephone Encounter (Signed)
Pt's wife had called back in after telling [place] that they were going to Urgent Care and stated that they only want to come to our office. I scheduled appt with Dr. Venia Minks for 3:30 this afternoon. After speaking with Dr. Venia Minks I called and spoke with pt and advised that he really needs to go to the ER. Pt stated that he still has the headache but really doesn't want to got to the ER. I advised that the ER could go ahead and get the scan ordered and treat the headache. I explained that it would be better for him to go to the ER. Pt stated that he would go ahead and go to the ER and to cancel the appt. Thanks TNP

## 2014-11-12 NOTE — Telephone Encounter (Signed)
Noted, thank you=-aa 

## 2014-11-12 NOTE — ED Notes (Signed)
Pt presents with headache for six days. Pt states normally does not have headaches and took some aspirin for the ha with no relief. Pt also states had an episode of "flash of light" come across his vision while he was driving and only lasted about 6-seconds and went away. Has not had an episode since but still continues to have headache. pcp is out of town and so pt came to ER.

## 2014-11-12 NOTE — ED Provider Notes (Signed)
Time Seen: Approximately ----------------------------------------- 3:46 PM on 11/12/2014 -----------------------------------------   I have reviewed the triage notes  Chief Complaint: Headache   History of Present Illness: Jeffrey Hendrix. is a 81 y.o. male who presents with a headache in the right frontal area and now for the last 6 days. Patient denies any trauma and states he's actually had a worse headache than this one couple years ago which was treated on an outpatient basis after a negative CT. Patient denies any nausea vomiting fever. He states he had one brief episode where he had some flashes of light and based on his description some trouble focusing which only was transient. He denies any other associated symptoms with it. He denies any eye pain or generalized weakness. He currently denies any visual disturbances, trouble with speech, trouble swallowing, etc. He has been ambulatory without difficulty.   Past Medical History  Diagnosis Date  . Hyperlipidemia   . Glaucoma   . Cancer [facility])     PROSTATE    Patient Active Problem List   Diagnosis Date Noted  . Personal history of colonic polyps 09/28/2013    Past Surgical History  Procedure Laterality Date  . Colon resection  2009  . Prostate surgery  1996  . Cataract extraction  2012, 2013  . Colonoscopy  09-14-08,09/2013.    Dr Jamal Collin  . Colonoscopy with propofol N/A 10/31/2014    Procedure: COLONOSCOPY WITH PROPOFOL;  Surgeon: Christene Lye, MD;  Location: [facility] ENDOSCOPY;  Service: Endoscopy;  Laterality: N/A;    Past Surgical History  Procedure Laterality Date  . Colon resection  2009  . Prostate surgery  1996  . Cataract extraction  2012, 2013  . Colonoscopy  09-14-08,09/2013.    Dr Jamal Collin  . Colonoscopy with propofol N/A 10/31/2014    Procedure: COLONOSCOPY WITH PROPOFOL;  Surgeon: Christene Lye, MD;  Location: [facility] ENDOSCOPY;  Service: Endoscopy;  Laterality: N/A;    Current  Outpatient Rx  Name  Route  Sig  Dispense  Refill  . hydrochlorothiazide (MICROZIDE) 12.5 MG capsule   Oral   Take 12.5 mg by mouth daily.         Marland Kitchen levothyroxine (SYNTHROID, LEVOTHROID) 50 MCG tablet   Oral   Take 50 mcg by mouth daily before breakfast.         . polyethylene glycol powder (GLYCOLAX/MIRALAX) powder   Oral   Take 255 g by mouth once.   255 g   0   . simvastatin (ZOCOR) 10 MG tablet   Oral   Take 10 mg by mouth daily at 6 PM.          . timolol (TIMOPTIC) 0.5 % ophthalmic solution               . traMADol (ULTRAM) 50 MG tablet   Oral   Take 1 tablet (50 mg total) by mouth every 6 (six) hours as needed.   20 tablet   0     Allergies:  Latex  Family History: No family history on file.  Social History: Social History  Substance Use Topics  . Smoking status: Never Smoker   . Smokeless tobacco: Never Used  . Alcohol Use: No     Review of Systems:   10 point review of systems was performed and was otherwise negative:  Constitutional: No fever Eyes: No visual disturbances ENT: No sore throat, ear pain Cardiac: No chest pain Respiratory: No shortness of breath, wheezing, or stridor Abdomen:  No abdominal pain, no vomiting, No diarrhea Endocrine: No weight loss, No night sweats Extremities: No peripheral edema, cyanosis Skin: No rashes, easy bruising Neurologic: No focal weakness, trouble with speech or swollowing Urologic: No dysuria, Hematuria, or urinary frequency   Physical Exam:  ED Triage Vitals  Enc Vitals Group     BP 11/12/14 1145 192/90 mmHg     Pulse Rate 11/12/14 1145 72     Resp 11/12/14 1145 20     Temp 11/12/14 1145 97.5 F (36.4 C)     Temp Source 11/12/14 1145 Oral     SpO2 11/12/14 1145 98 %     Weight 11/12/14 1145 175 lb (79.379 kg)     Height 11/12/14 1145 6' (1.829 m)     Head Cir --      Peak Flow --      Pain Score 11/12/14 1145 6     Pain Loc --      Pain Edu? --      Excl. in [place]? --      General: Awake , Alert , and Oriented times 3; GCS 15 Head: Normal cephalic , atraumatic. No reproducible associated pain across palpation of the right temporal frontal region and maxillary sinuses. Eyes: Pupils equal , round, reactive to light Nose/Throat: No nasal drainage, patent upper airway without erythema or exudate. No meningeal signs Neck: Supple, Full range of motion, No anterior adenopathy or palpable thyroid masses Lungs: Clear to ascultation without wheezes , rhonchi, or rales Heart: Regular rate, regular rhythm without murmurs , gallops , or rubs Abdomen: Soft, non tender without rebound, guarding , or rigidity; bowel sounds positive and symmetric in all 4 quadrants. No organomegaly .        Extremities: 2 plus symmetric pulses. No edema, clubbing or cyanosis Neurologic: normal ambulation, Motor symmetric without deficits, sensory intact Skin: warm, dry, no rashes    Radiology:  EXAM: CT HEAD WITHOUT CONTRAST  TECHNIQUE: Contiguous axial images were obtained from the base of the skull through the vertex without intravenous contrast.  COMPARISON: Brain MRI August 15, 2011  FINDINGS: Mild diffuse atrophy is stable. There is no intracranial mass, hemorrhage, extra-axial fluid collection, or midline shift. There is patchy small vessel disease in the centra semiovale bilaterally. No acute infarct evident. Bony calvarium appears intact. Visualized mastoid air cells are clear.  IMPRESSION: Stable atrophy with periventricular small vessel disease. No intracranial mass, hemorrhage, or acute appearing infarct.      I personally reviewed the radiologic studies    ED Course:  Patient's stay here was uneventful. Differential diagnosis includes but is not exclusive to subarachnoid hemorrhage, meningitis, encephalitis, previous head trauma, cavernous venous thrombosis, muscle tension headache, temporal arteritis, migraine or migraine equivalent, etc. Given the  patient's current clinical presentation and objective findings I wasn't sure the exact nature of his headache though seems to be muscle tension given the location and characteristics. The patient and either are of agreement that he does not need any further testing at this time and will follow up as directed.   Assessment:  Acute unspecified cephalgia   Final Clinical Impression:   Final diagnoses:  Acute nonintractable headache, unspecified headache type     Plan:  Patient was advised to return immediately if condition worsens. Patient was advised to follow up with her primary care physician or other specialized physicians involved and in their current assessment.            Daymon Larsen, MD 11/12/14 [phone]

## 2014-11-28 DIAGNOSIS — R251 Tremor, unspecified: Secondary | ICD-10-CM | POA: Insufficient documentation

## 2014-11-28 DIAGNOSIS — I10 Essential (primary) hypertension: Secondary | ICD-10-CM | POA: Insufficient documentation

## 2014-11-28 DIAGNOSIS — K2100 Gastro-esophageal reflux disease with esophagitis, without bleeding: Secondary | ICD-10-CM | POA: Insufficient documentation

## 2014-11-28 DIAGNOSIS — E785 Hyperlipidemia, unspecified: Secondary | ICD-10-CM | POA: Insufficient documentation

## 2014-11-28 DIAGNOSIS — K449 Diaphragmatic hernia without obstruction or gangrene: Secondary | ICD-10-CM | POA: Insufficient documentation

## 2014-11-28 DIAGNOSIS — N400 Enlarged prostate without lower urinary tract symptoms: Secondary | ICD-10-CM | POA: Insufficient documentation

## 2014-11-28 DIAGNOSIS — E039 Hypothyroidism, unspecified: Secondary | ICD-10-CM | POA: Insufficient documentation

## 2014-11-28 DIAGNOSIS — K635 Polyp of colon: Secondary | ICD-10-CM | POA: Insufficient documentation

## 2014-12-03 ENCOUNTER — Ambulatory Visit (INDEPENDENT_AMBULATORY_CARE_PROVIDER_SITE_OTHER): Payer: Commercial Managed Care - HMO | Admitting: Family Medicine

## 2014-12-03 ENCOUNTER — Encounter: Payer: Self-pay | Admitting: Family Medicine

## 2014-12-03 VITALS — BP 152/90 | HR 88 | Temp 97.8°F | Resp 16 | Wt 170.0 lb

## 2014-12-03 DIAGNOSIS — R1084 Generalized abdominal pain: Secondary | ICD-10-CM | POA: Diagnosis not present

## 2014-12-03 DIAGNOSIS — E785 Hyperlipidemia, unspecified: Secondary | ICD-10-CM | POA: Diagnosis not present

## 2014-12-03 DIAGNOSIS — I10 Essential (primary) hypertension: Secondary | ICD-10-CM | POA: Diagnosis not present

## 2014-12-03 DIAGNOSIS — Z09 Encounter for follow-up examination after completed treatment for conditions other than malignant neoplasm: Secondary | ICD-10-CM

## 2014-12-03 MED ORDER — OMEPRAZOLE 20 MG PO CPDR: 20.0000 mg | DELAYED_RELEASE_CAPSULE | Freq: Every day | ORAL | Status: DC

## 2014-12-03 MED ORDER — HYDROCHLOROTHIAZIDE 12.5 MG PO CAPS: 12.5000 mg | ORAL_CAPSULE | Freq: Every day | ORAL | Status: DC

## 2014-12-03 NOTE — Progress Notes (Signed)
Patient ID: Jeffrey Hendrix., male   DOB: 03/22/1924, 81 y.o.   MRN: [id number]   Jeffrey Hendrix Jeffrey Hendrix.  MRN: [id number] DOB: [birth year]  Subjective:  HPI   1. Hospital discharge follow-up Patient is an 81 year old male who presents today for follow up after being seen in the ED on 11/12/14.  Patient reports he was having headaches for about 3 days.  He was driving home on [id number] when he had an unusual event happen that he described as a feeling of being in a fog, funny sensation about his head and he pulled over and told his wife he was unable to drive.  They switched places and before she even started to drive off he said his head felt better.  They went home and call the office on that following Monday (11/12/14) to find that Dr. Rosanna Randy was out of the office for 1 week.  After speaking to someone else in the office he was advised to go to the ED which he did.  They did a CT scan of his head and reported it was ok.  He was prescribed Tramodol and states that he took one on the next day and has not had anymore trouble since then.  2. Generalized abdominal pain Patient reports he has had a "sick" feeling in his lower abdomen/pelvis area that started about 3 months ago.  He has history of colon resection, in 2009.  He also has history  of a cancerous polyp in the rectum that Dr. Jamal Collin removed, around 09/2013 .  Patient was seeing Dr Jamal Collin for his follow up on that and discussed his abdominal pain.  He has since had a colonoscopy with Dr Jamal Collin in October.  He reports that it was ok.  Patient describes his pain as moving from his pelvic area to a little higher and on the left.  He has not noticed any relationship to food, activity or time of day.  He has not had any change in his bowel habits and the only associated symptom other than the pain is nausea.   Patient Active Problem List   Diagnosis Date Noted  . Benign fibroma of prostate 11/28/2014  . Colon polyp 11/28/2014  . Essential  (primary) hypertension 11/28/2014  . Bergmann's syndrome 11/28/2014  . HLD (hyperlipidemia) 11/28/2014  . Adult hypothyroidism 11/28/2014  . Esophagitis, reflux 11/28/2014  . Has a tremor 11/28/2014  . Glaucoma 04/19/2014  . Personal history of colonic polyps 09/28/2013  . History of colon polyps 09/28/2013    Past Medical History  Diagnosis Date  . Hyperlipidemia   . Glaucoma   . Cancer [facility])     PROSTATE    Social History   Social History  . Marital Status: Married    Spouse Name: N/A  . Number of Children: N/A  . Years of Education: N/A   Occupational History  . Not on file.   Social History Main Topics  . Smoking status: Never Smoker   . Smokeless tobacco: Never Used  . Alcohol Use: No  . Drug Use: No  . Sexual Activity: Not on file   Other Topics Concern  . Not on file   Social History Narrative    Outpatient Prescriptions Prior to Visit  Medication Sig Dispense Refill  . hydrochlorothiazide (MICROZIDE) 12.5 MG capsule Take 12.5 mg by mouth daily.    Marland Kitchen levothyroxine (SYNTHROID, LEVOTHROID) 50 MCG tablet Take 50 mcg by mouth daily before breakfast.    . simvastatin (  ZOCOR) 10 MG tablet Take 10 mg by mouth daily at 6 PM.     . timolol (TIMOPTIC) 0.5 % ophthalmic solution     . traMADol (ULTRAM) 50 MG tablet Take 1 tablet (50 mg total) by mouth every 6 (six) hours as needed. (Patient not taking: Reported on 12/03/2014) 20 tablet 0  . polyethylene glycol powder (GLYCOLAX/MIRALAX) powder Take 255 g by mouth once. (Patient not taking: Reported on 12/03/2014) 255 g 0   No facility-administered medications prior to visit.    Allergies  Allergen Reactions  . Doxycycline   . Latex   . Sulfa Antibiotics   . Pce  [Erythromycin] Rash    Review of Systems  Constitutional: Negative for fever, chills, weight loss and malaise/fatigue.  Respiratory: Negative for cough, hemoptysis, sputum production, shortness of breath and wheezing.   Cardiovascular: Negative for  chest pain, palpitations, orthopnea and leg swelling.  Gastrointestinal: Positive for nausea, abdominal pain and constipation (Some last week for a day or two). Negative for heartburn, vomiting, diarrhea, blood in stool and melena.       Patient reports some dysphagia with certain foods.   Genitourinary: Negative for dysuria, urgency, frequency and hematuria.  Neurological: Negative for weakness.  Psychiatric/Behavioral: Negative.    Objective:  BP 152/90 mmHg  Pulse 88  Temp(Src) 97.8 F (36.6 C) (Oral)  Resp 16  Wt 170 lb (77.111 kg)  Physical Exam  Constitutional: He is oriented to person, place, and time and well-developed, well-nourished, and in no distress.  HENT:  Head: Normocephalic and atraumatic.  Right Ear: External ear normal.  Left Ear: External ear normal.  Nose: Nose normal.  Eyes: Conjunctivae are normal.  Neck: Neck supple.  Cardiovascular: Normal rate, regular rhythm and normal heart sounds.   Pulmonary/Chest: Effort normal and breath sounds normal.  Abdominal: Soft.  Neurological: He is alert and oriented to person, place, and time. Gait normal.  Grossly nonfocal exam.  Skin: Skin is warm and dry.  Psychiatric: Mood, memory, affect and judgment normal.    Assessment and Plan :  Hospital discharge follow-up  Generalized abdominal pain/most likely gastritis/PUD Omeprazole 20mg  daily Headaches Resolved.No NSAIDs. HTN Miguel Aschoff MD [facility] 12/03/2014 11:17 AM

## 2014-12-05 LAB — CBC WITH DIFFERENTIAL/PLATELET

## 2014-12-05 LAB — COMPREHENSIVE METABOLIC PANEL
ALBUMIN: 4.2 g/dL (ref 3.5–4.7)
ALK PHOS: 75 IU/L (ref 39–117)
ALT: 13 IU/L (ref 0–44)
AST: 17 IU/L (ref 0–40)
Albumin/Globulin Ratio: 1.5 (ref 1.1–2.5)
BUN / CREAT RATIO: 11 (ref 10–22)
BUN: 13 mg/dL (ref 8–27)
Bilirubin Total: 0.8 mg/dL (ref 0.0–1.2)
CALCIUM: 9.4 mg/dL (ref 8.6–10.2)
CO2: 25 mmol/L (ref 18–29)
CREATININE: 1.17 mg/dL (ref 0.76–1.27)
Chloride: 100 mmol/L (ref 97–106)
GFR calc Af Amer: 64 mL/min/{1.73_m2} (ref >59)
GFR, EST NON AFRICAN AMERICAN: 55 mL/min/{1.73_m2} — AB (ref >59)
GLOBULIN, TOTAL: 2.8 g/dL (ref 1.5–4.5)
GLUCOSE: 99 mg/dL (ref 65–99)
Potassium: 4.5 mmol/L (ref 3.5–5.2)
SODIUM: 141 mmol/L (ref 136–144)
TOTAL PROTEIN: 7 g/dL (ref 6.0–8.5)

## 2014-12-05 LAB — H. PYLORI ANTIBODY, IGG: H Pylori IgG: <0.9 U/mL (ref 0.0–0.8)

## 2014-12-05 LAB — LIPID PANEL WITH LDL/HDL RATIO
CHOLESTEROL TOTAL: 172 mg/dL (ref 100–199)
HDL: 54 mg/dL (ref >39)
LDL CALC: 93 mg/dL (ref 0–99)
LDl/HDL Ratio: 1.7 ratio units (ref 0.0–3.6)
Triglycerides: 125 mg/dL (ref 0–149)
VLDL CHOLESTEROL CAL: 25 mg/dL (ref 5–40)

## 2014-12-05 LAB — TSH: TSH: 3.86 u[IU]/mL (ref 0.450–4.500)

## 2014-12-05 LAB — LIPASE: LIPASE: 26 U/L (ref 0–59)

## 2014-12-05 LAB — AMYLASE: Amylase: 33 U/L (ref 31–124)

## 2014-12-06 ENCOUNTER — Telehealth: Payer: Self-pay | Admitting: Family Medicine

## 2014-12-06 NOTE — Telephone Encounter (Signed)
Patient is calling to get the results of his labs that was recently done this week. Please advise. Call home phone [phone] (H)  but if no answer call cell phone [phone]

## 2014-12-06 NOTE — Telephone Encounter (Signed)
-----   Message from Jerrol Banana., MD sent at 12/06/2014 11:19 AM EST ----- Labs all normal.

## 2014-12-06 NOTE — Telephone Encounter (Signed)
LMTCB ED 

## 2014-12-07 NOTE — Telephone Encounter (Signed)
Pt informed and voiced understanding of results. 

## 2014-12-24 ENCOUNTER — Other Ambulatory Visit: Payer: Self-pay | Admitting: Family Medicine

## 2014-12-24 DIAGNOSIS — L820 Inflamed seborrheic keratosis: Secondary | ICD-10-CM | POA: Diagnosis not present

## 2014-12-24 DIAGNOSIS — L538 Other specified erythematous conditions: Secondary | ICD-10-CM | POA: Diagnosis not present

## 2014-12-24 DIAGNOSIS — L298 Other pruritus: Secondary | ICD-10-CM | POA: Diagnosis not present

## 2014-12-24 MED ORDER — HYDROCHLOROTHIAZIDE 12.5 MG PO TABS: ORAL_TABLET | ORAL | Status: DC

## 2014-12-24 NOTE — Telephone Encounter (Signed)
Pt stated that the RX for hydrochlorothiazide (MICROZIDE) 12.5 MG capsule that was sent to [facility] should have been for tablets b/c he only takes 1/2 of a tablet a day. Pt stated that he contacted [employer] last week and they were supposed to contact the office. Pt stated he is out of the tablets and would like a new RX sent to [facility] for tablets and a 7 day supply sent to [facility] [address]. To last until the RX comes from [facility]. Pt would like a call back when this has been sent. Thanks TNP

## 2014-12-24 NOTE — Telephone Encounter (Signed)
Pt advised, RXs re sent-aa

## 2014-12-31 ENCOUNTER — Ambulatory Visit (INDEPENDENT_AMBULATORY_CARE_PROVIDER_SITE_OTHER): Payer: Commercial Managed Care - HMO | Admitting: Family Medicine

## 2014-12-31 ENCOUNTER — Encounter: Payer: Self-pay | Admitting: Family Medicine

## 2014-12-31 VITALS — BP 146/80 | HR 80 | Temp 97.8°F | Resp 16 | Wt 172.0 lb

## 2014-12-31 DIAGNOSIS — K2100 Gastro-esophageal reflux disease with esophagitis, without bleeding: Secondary | ICD-10-CM

## 2014-12-31 NOTE — Progress Notes (Signed)
Patient ID: [name], male   DOB: 08-16-1924, 81 y.o.   MRN: [id number]    Subjective:  HPI Pt is her for a follow up of GERD. He was started on Omeprazole last OV. He reports that he started taking the medication and seemed to work but he had side effects to it. His throat swelled up to where he could barely swallow but could still breath, his mouth and throat were very dry. It made his BP go up and indigestion worse. He then opened up the capsule and poured out half the medication inside and put the capsule back together and took it for about 2 and a half weeks and his symptoms got better, even the initial reason he was taking it (abdominal pain). About a week ago he stopped it all together and ALL the symptoms went away. He does not think he needs the medication anymore for this. He has not had the pain in the chest since he stopped the medication.  Prior to Admission medications   Medication Sig Start Date End Date Taking? Authorizing Provider  hydrochlorothiazide (HYDRODIURIL) 12.5 MG tablet Takes 1/2 tablet daily 12/24/14  Yes Gianah Batt Maceo Pro., MD  levothyroxine (SYNTHROID, LEVOTHROID) 50 MCG tablet Take 50 mcg by mouth daily before breakfast.   Yes Historical Provider, MD  simvastatin (ZOCOR) 10 MG tablet Take 10 mg by mouth daily at 6 PM.  09/05/13  Yes Historical Provider, MD  timolol (TIMOPTIC) 0.5 % ophthalmic solution  03/19/14  Yes Historical Provider, MD  traMADol (ULTRAM) 50 MG tablet Take 1 tablet (50 mg total) by mouth every 6 (six) hours as needed. 11/12/14  Yes Daymon Larsen, MD    Patient Active Problem List   Diagnosis Date Noted  . Benign fibroma of prostate 11/28/2014  . Colon polyp 11/28/2014  . Essential (primary) hypertension 11/28/2014  . Bergmann's syndrome 11/28/2014  . HLD (hyperlipidemia) 11/28/2014  . Adult hypothyroidism 11/28/2014  . Esophagitis, reflux 11/28/2014  . Has a tremor 11/28/2014  . Glaucoma 04/19/2014  . Personal history of  colonic polyps 09/28/2013  . History of colon polyps 09/28/2013    Past Medical History  Diagnosis Date  . Hyperlipidemia   . Glaucoma   . Cancer [facility])     PROSTATE    Social History   Social History  . Marital Status: Married    Spouse Name: N/A  . Number of Children: N/A  . Years of Education: N/A   Occupational History  . Not on file.   Social History Main Topics  . Smoking status: Never Smoker   . Smokeless tobacco: Never Used  . Alcohol Use: No  . Drug Use: No  . Sexual Activity: Not on file   Other Topics Concern  . Not on file   Social History Narrative    Allergies  Allergen Reactions  . Doxycycline   . Latex   . Omeprazole Swelling  . Sulfa Antibiotics   . Pce  [Erythromycin] Rash    Review of Systems  Constitutional: Negative.   HENT: Negative.   Eyes: Negative.   Respiratory: Negative.   Cardiovascular: Negative.   Gastrointestinal: Negative.   Genitourinary: Negative.   Musculoskeletal: Negative.   Skin: Negative.   Neurological: Negative.   Endo/Heme/Allergies: Negative.   Psychiatric/Behavioral: Negative.     Immunization History  Administered Date(s) Administered  . Pneumococcal Polysaccharide-23 01/25/1992  . Td 01/22/2010  . Tdap 08/02/2012  . Zoster 01/16/2013   Objective:  BP 146/80  mmHg  Pulse 80  Temp(Src) 97.8 F (36.6 C) (Oral)  Resp 16  Wt 172 lb (78.019 kg)  Physical Exam  Lab Results  Component Value Date   WBC CANCELED 12/03/2014   HGB 16.6 04/25/2014   HCT CANCELED 12/03/2014   PLT 269 04/25/2014   GLUCOSE 99 12/03/2014   CHOL 172 12/03/2014   TRIG 125 12/03/2014   HDL 54 12/03/2014   LDLCALC 93 12/03/2014   TSH 3.860 12/03/2014    CMP     Component Value Date/Time   NA 141 12/03/2014 1214   NA 138 04/25/2014 2317   K 4.5 12/03/2014 1214   K 4.0 04/25/2014 2317   CL 100 12/03/2014 1214   CL 103 04/25/2014 2317   CO2 25 12/03/2014 1214   CO2 29 04/25/2014 2317   GLUCOSE 99 12/03/2014 1214     GLUCOSE 96 04/25/2014 2317   BUN 13 12/03/2014 1214   BUN 16 04/25/2014 2317   CREATININE 1.17 12/03/2014 1214   CREATININE 1.1 06/04/2014   CREATININE 1.03 04/25/2014 2317   CALCIUM 9.4 12/03/2014 1214   CALCIUM 9.5 04/25/2014 2317   PROT 7.0 12/03/2014 1214   ALBUMIN 4.2 12/03/2014 1214   AST 17 12/03/2014 1214   ALT 13 12/03/2014 1214   ALKPHOS 75 12/03/2014 1214   BILITOT 0.8 12/03/2014 1214   GFRNONAA 55* 12/03/2014 1214   GFRNONAA >60 04/25/2014 2317   GFRAA 64 12/03/2014 1214   GFRAA >60 04/25/2014 2317    Assessment and Plan :  1. Esophagitis, reflux Symptoms resolved. 2. Intolerance to PPI For future treatment would use ranitidine or Pepcid. 3. Hypertension Patient wishes no changes to his therapy I have done the exam and reviewed the above chart and it is accurate to the best of my knowledge.   Miguel Aschoff MD [facility] 12/31/2014 10:51 AM

## 2015-03-06 DIAGNOSIS — S3091XA Unspecified superficial injury of lower back and pelvis, initial encounter: Secondary | ICD-10-CM | POA: Diagnosis not present

## 2015-03-06 DIAGNOSIS — L570 Actinic keratosis: Secondary | ICD-10-CM | POA: Diagnosis not present

## 2015-03-06 DIAGNOSIS — Z85828 Personal history of other malignant neoplasm of skin: Secondary | ICD-10-CM | POA: Diagnosis not present

## 2015-03-06 DIAGNOSIS — R58 Hemorrhage, not elsewhere classified: Secondary | ICD-10-CM | POA: Diagnosis not present

## 2015-03-06 DIAGNOSIS — D225 Melanocytic nevi of trunk: Secondary | ICD-10-CM | POA: Diagnosis not present

## 2015-03-06 DIAGNOSIS — L820 Inflamed seborrheic keratosis: Secondary | ICD-10-CM | POA: Diagnosis not present

## 2015-03-06 DIAGNOSIS — L538 Other specified erythematous conditions: Secondary | ICD-10-CM | POA: Diagnosis not present

## 2015-03-06 DIAGNOSIS — L821 Other seborrheic keratosis: Secondary | ICD-10-CM | POA: Diagnosis not present

## 2015-03-06 DIAGNOSIS — X32XXXA Exposure to sunlight, initial encounter: Secondary | ICD-10-CM | POA: Diagnosis not present

## 2015-03-21 ENCOUNTER — Telehealth: Payer: Self-pay | Admitting: Family Medicine

## 2015-03-21 ENCOUNTER — Other Ambulatory Visit: Payer: Self-pay

## 2015-03-21 MED ORDER — SIMVASTATIN 10 MG PO TABS: 10.0000 mg | ORAL_TABLET | Freq: Every day | ORAL | Status: DC

## 2015-03-21 NOTE — Telephone Encounter (Signed)
Pt's wife contacted office for refill request on the following medications: simvastatin (ZOCOR) 10 MG tablet to [facility] order. Thanks TNP

## 2015-03-21 NOTE — Telephone Encounter (Signed)
Done  ED 

## 2015-05-09 DIAGNOSIS — H401133 Primary open-angle glaucoma, bilateral, severe stage: Secondary | ICD-10-CM | POA: Diagnosis not present

## 2015-06-19 ENCOUNTER — Other Ambulatory Visit: Payer: Self-pay | Admitting: Family Medicine

## 2015-07-02 ENCOUNTER — Encounter: Payer: Self-pay | Admitting: Family Medicine

## 2015-07-02 ENCOUNTER — Ambulatory Visit (INDEPENDENT_AMBULATORY_CARE_PROVIDER_SITE_OTHER): Payer: Commercial Managed Care - HMO | Admitting: Family Medicine

## 2015-07-02 VITALS — BP 142/82 | HR 72 | Temp 97.5°F | Resp 16 | Wt 167.0 lb

## 2015-07-02 DIAGNOSIS — E785 Hyperlipidemia, unspecified: Secondary | ICD-10-CM

## 2015-07-02 DIAGNOSIS — E039 Hypothyroidism, unspecified: Secondary | ICD-10-CM | POA: Diagnosis not present

## 2015-07-02 DIAGNOSIS — B351 Tinea unguium: Secondary | ICD-10-CM | POA: Diagnosis not present

## 2015-07-02 DIAGNOSIS — I10 Essential (primary) hypertension: Secondary | ICD-10-CM | POA: Diagnosis not present

## 2015-07-02 NOTE — Progress Notes (Signed)
Patient ID: Jeffrey Eve., male   DOB: 03-Mar-1924, 82 y.o.   MRN: [id number]    Subjective:  HPI  Hypertension, follow-up:  BP Readings from Last 3 Encounters:  07/02/15 142/82  12/31/14 146/80  12/03/14 152/90    He was last seen for hypertension 6 months ago.  BP at that visit was 146/80. Management since that visit includes none. He reports good compliance with treatment. He is not having side effects.  He is not exercising. He is adherent to low salt diet.   Outside blood pressures are running about 108/59. He is experiencing none.  Patient denies chest pain, chest pressure/discomfort, claudication, dyspnea, exertional chest pressure/discomfort, fatigue, irregular heart beat, lower extremity edema, near-syncope, orthopnea, palpitations, paroxysmal nocturnal dyspnea and syncope.   Cardiovascular risk factors include advanced age (older than 74 for men, 37 for women), dyslipidemia, hypertension and male gender.   Wt Readings from Last 3 Encounters:  07/02/15 167 lb (75.751 kg)  12/31/14 172 lb (78.019 kg)  12/03/14 170 lb (77.111 kg)   ------------------------------------------------------------------------    Lipid/Cholesterol, Follow-up:   Last seen for this 6 months ago.  Management changes since that visit include none. . Last Lipid Panel:    Component Value Date/Time   CHOL 172 12/03/2014 1214   CHOL 160 11/09/2013   TRIG 125 12/03/2014 1214   HDL 54 12/03/2014 1214   HDL 54 11/09/2013   LDLCALC 93 12/03/2014 1214   LDLCALC 77 11/09/2013     He reports good compliance with treatment. He is not having side effects.  Current symptoms include none and have been unchanged. Weight trend: stable  Wt Readings from Last 3 Encounters:  07/02/15 167 lb (75.751 kg)  12/31/14 172 lb (78.019 kg)  12/03/14 170 lb (77.111 kg)    ------------------------------------------------------------------- Pt reports that about a couple weeks ago he noticed he  had a white spot on the side of left great toe. He reports that it does not hurt but he wants to make sure it is not a fungus or anything that he needs to be concerned with.  He also reports that he had a "pocket type" place come up on his left knee, but has since gone away. Denies knee pain but feels like it "catches" sometimes.   Prior to Admission medications   Medication Sig Start Date End Date Taking? Authorizing Provider  hydrochlorothiazide (HYDRODIURIL) 12.5 MG tablet Takes 1/2 tablet daily 12/24/14  Yes Richard Maceo Pro., MD  levothyroxine (SYNTHROID, LEVOTHROID) 50 MCG tablet TAKE 1 TABLET EVERY DAY 06/19/15  Yes Jerrol Banana., MD  simvastatin (ZOCOR) 10 MG tablet Take 1 tablet (10 mg total) by mouth daily at 6 PM. 03/21/15  Yes Jerrol Banana., MD  timolol (TIMOPTIC) 0.5 % ophthalmic solution  03/19/14  Yes Historical Provider, MD  traMADol (ULTRAM) 50 MG tablet Take 1 tablet (50 mg total) by mouth every 6 (six) hours as needed. 11/12/14  Yes Daymon Larsen, MD    Patient Active Problem List   Diagnosis Date Noted  . Benign fibroma of prostate 11/28/2014  . Colon polyp 11/28/2014  . Essential (primary) hypertension 11/28/2014  . Bergmann's syndrome 11/28/2014  . HLD (hyperlipidemia) 11/28/2014  . Adult hypothyroidism 11/28/2014  . Esophagitis, reflux 11/28/2014  . Has a tremor 11/28/2014  . Glaucoma 04/19/2014  . Personal history of colonic polyps 09/28/2013  . History of colon polyps 09/28/2013    Past Medical History  Diagnosis Date  . Hyperlipidemia   .  Glaucoma   . Cancer [facility])     PROSTATE    Social History   Social History  . Marital Status: Married    Spouse Name: N/A  . Number of Children: N/A  . Years of Education: N/A   Occupational History  . Not on file.   Social History Main Topics  . Smoking status: Never Smoker   . Smokeless tobacco: Never Used  . Alcohol Use: No  . Drug Use: No  . Sexual Activity: Not on file   Other  Topics Concern  . Not on file   Social History Narrative    Allergies  Allergen Reactions  . Doxycycline   . Latex   . Omeprazole Swelling  . Sulfa Antibiotics   . Pce  [Erythromycin] Rash    Review of Systems  Constitutional: Negative.   HENT: Negative.   Eyes: Negative.   Respiratory: Negative.   Cardiovascular: Negative.   Gastrointestinal: Negative.   Genitourinary: Negative.   Skin: Negative.        White place on side of left great toe.  Neurological: Negative.   Endo/Heme/Allergies: Negative.   Psychiatric/Behavioral: Negative.     Immunization History  Administered Date(s) Administered  . Pneumococcal Polysaccharide-23 01/25/1992  . Td 01/22/2010  . Tdap 08/02/2012  . Zoster 01/16/2013   Objective:  BP 142/82 mmHg  Pulse 72  Temp(Src) 97.5 F (36.4 C) (Oral)  Resp 16  Wt 167 lb (75.751 kg)  Physical Exam  Constitutional: He is oriented to person, place, and time and well-developed, well-nourished, and in no distress.  Eyes: Conjunctivae and EOM are normal. Pupils are equal, round, and reactive to light.  Neck: Normal range of motion. Neck supple.  Cardiovascular: Normal rate, regular rhythm, normal heart sounds and intact distal pulses.   Pulmonary/Chest: Effort normal and breath sounds normal.  Neurological: He is alert and oriented to person, place, and time. He has normal reflexes. Gait normal. GCS score is 15.  Skin: Skin is warm and dry.  Psychiatric: Mood, memory, affect and judgment normal.  Knee normal. Minimal thinckening of right medial great nail.  Lab Results  Component Value Date   WBC CANCELED 12/03/2014   HGB 16.6 04/25/2014   HCT CANCELED 12/03/2014   PLT CANCELED 12/03/2014   GLUCOSE 99 12/03/2014   CHOL 172 12/03/2014   TRIG 125 12/03/2014   HDL 54 12/03/2014   LDLCALC 93 12/03/2014   TSH 3.860 12/03/2014    CMP     Component Value Date/Time   NA 141 12/03/2014 1214   NA 138 04/25/2014 2317   K 4.5 12/03/2014 1214     K 4.0 04/25/2014 2317   CL 100 12/03/2014 1214   CL 103 04/25/2014 2317   CO2 25 12/03/2014 1214   CO2 29 04/25/2014 2317   GLUCOSE 99 12/03/2014 1214   GLUCOSE 96 04/25/2014 2317   BUN 13 12/03/2014 1214   BUN 16 04/25/2014 2317   CREATININE 1.17 12/03/2014 1214   CREATININE 1.1 06/04/2014   CREATININE 1.03 04/25/2014 2317   CALCIUM 9.4 12/03/2014 1214   CALCIUM 9.5 04/25/2014 2317   PROT 7.0 12/03/2014 1214   ALBUMIN 4.2 12/03/2014 1214   AST 17 12/03/2014 1214   ALT 13 12/03/2014 1214   ALKPHOS 75 12/03/2014 1214   BILITOT 0.8 12/03/2014 1214   GFRNONAA 55* 12/03/2014 1214   GFRNONAA >60 04/25/2014 2317   GFRAA 64 12/03/2014 1214   GFRAA >60 04/25/2014 2317    Assessment and Plan :  1. Hypothyroidism, unspecified hypothyroidism type  2. Essential (primary) hypertension Stable. Continue current medication. Follow up in 4-6 months.  3. HLD (hyperlipidemia) stable continue current medications   4. Onychomycosis possible Possible. Will refer to derm to be sure and for correct treatment. - Ambulatory referral to Dermatology 5.Knee Bursitis Resolved Patient was seen and examined by Dr. Miguel Aschoff, and noted scribed by Webb Laws, [place] MD [facility] 07/02/2015 11:05 AM

## 2015-08-05 DIAGNOSIS — B351 Tinea unguium: Secondary | ICD-10-CM | POA: Diagnosis not present

## 2015-08-05 DIAGNOSIS — L570 Actinic keratosis: Secondary | ICD-10-CM | POA: Diagnosis not present

## 2015-08-05 DIAGNOSIS — X32XXXA Exposure to sunlight, initial encounter: Secondary | ICD-10-CM | POA: Diagnosis not present

## 2015-10-07 ENCOUNTER — Telehealth: Payer: Self-pay | Admitting: Family Medicine

## 2015-10-07 DIAGNOSIS — B351 Tinea unguium: Secondary | ICD-10-CM

## 2015-10-07 NOTE — Telephone Encounter (Signed)
Ok with me 

## 2015-10-07 NOTE — Telephone Encounter (Signed)
Pt's wife Cyndra Numbers) called requesting referral to podiatrist at [facility] for possible nail fungus on left foot,big toe.Nail is loose.Cyndra Numbers states that he saw Dr Evorn Gong for this but toe has not gotten any better.Problem has worsened

## 2015-10-07 NOTE — Telephone Encounter (Signed)
Please review-aa 

## 2015-10-08 NOTE — Telephone Encounter (Signed)
Order put in-aa 

## 2015-10-23 DIAGNOSIS — M79675 Pain in left toe(s): Secondary | ICD-10-CM | POA: Diagnosis not present

## 2015-10-23 DIAGNOSIS — B351 Tinea unguium: Secondary | ICD-10-CM | POA: Diagnosis not present

## 2015-11-04 ENCOUNTER — Other Ambulatory Visit: Payer: Self-pay | Admitting: Family Medicine

## 2015-12-20 DIAGNOSIS — H401133 Primary open-angle glaucoma, bilateral, severe stage: Secondary | ICD-10-CM | POA: Diagnosis not present

## 2015-12-23 DIAGNOSIS — H401133 Primary open-angle glaucoma, bilateral, severe stage: Secondary | ICD-10-CM | POA: Diagnosis not present

## 2015-12-31 ENCOUNTER — Ambulatory Visit (INDEPENDENT_AMBULATORY_CARE_PROVIDER_SITE_OTHER): Payer: Commercial Managed Care - HMO | Admitting: Family Medicine

## 2015-12-31 ENCOUNTER — Encounter: Payer: Self-pay | Admitting: Family Medicine

## 2015-12-31 VITALS — BP 132/68 | HR 80 | Temp 97.6°F | Resp 16 | Wt 170.0 lb

## 2015-12-31 DIAGNOSIS — I10 Essential (primary) hypertension: Secondary | ICD-10-CM

## 2015-12-31 DIAGNOSIS — K219 Gastro-esophageal reflux disease without esophagitis: Secondary | ICD-10-CM

## 2015-12-31 DIAGNOSIS — E039 Hypothyroidism, unspecified: Secondary | ICD-10-CM

## 2015-12-31 DIAGNOSIS — E785 Hyperlipidemia, unspecified: Secondary | ICD-10-CM

## 2015-12-31 DIAGNOSIS — R42 Dizziness and giddiness: Secondary | ICD-10-CM | POA: Diagnosis not present

## 2015-12-31 MED ORDER — RANITIDINE HCL 150 MG PO CAPS: 150.0000 mg | ORAL_CAPSULE | Freq: Two times a day (BID) | ORAL | 12 refills | Status: DC

## 2015-12-31 NOTE — Progress Notes (Signed)
[place].  MRN: [id number] DOB: April 02, 1924  Subjective:  HPI   The patient is an 82 year old male who presents for evaluation of nausea.  He states he has had 2 episodes of nausea with the first one being 2-3 months ago.  He describes it as a sick feeling in his lower abdominal area slightly to the left.  He states that he had been working outside in the yard for a few hours and when he came in he felt sick.  The second episode occurred about 1 month ago.  Again, he was working outside in the yard and when he finished he was nauseated. He states he drank a little bit of coke and it gave him some relief. He states that 5 days ago he had abdominal pain that lasted for several hours.  He had no nausea on that episode and with the episodes of nausea he had no pain.   The patient states that he has been getting nauseated after taking his thyroid medication in the morning also.  He states he does not wake up feeling bad but after taking the medicine and drinking the cold water he gets a little queazy. He has tried taking it after eating to see if this wold help but it did not.   He also relates that he can be sitting around not doing anything and can get a little nauseated. He does note that when he feels this way if he eats a little something it does get better.   The patient denies having any indigestion.  He does admit that on 2 occassions he had trouble with swallowing his food.  He said he is very good about chewing his food well and eating slow but for some reason the food got stuck midway down. The patient has had trouble recently with episodes of having a burp and if he burps after eating he will have some food come up.  He states this especially happens when he eats ice cream.  Rare occasion with food but predictable with ice cream.  The patient did have an endoscopy several years ago after complaining of epigastric pain.  He said that they did not find anything but gave him some  pills to take and it got better.    The patient was under the impression he was here for a physical also.  He wanted t have labwork done.  The patient notes that he has had some equal librium problems for the past few days.  Not really dizziness, he just feels a little off balance when he walks.  Patient Active Problem List   Diagnosis Date Noted  . Benign fibroma of prostate 11/28/2014  . Colon polyp 11/28/2014  . Essential (primary) hypertension 11/28/2014  . Bergmann's syndrome 11/28/2014  . HLD (hyperlipidemia) 11/28/2014  . Adult hypothyroidism 11/28/2014  . Esophagitis, reflux 11/28/2014  . Has a tremor 11/28/2014  . Glaucoma 04/19/2014  . Personal history of colonic polyps 09/28/2013  . History of colon polyps 09/28/2013    Past Medical History:  Diagnosis Date  . Cancer ([place])    PROSTATE  . Glaucoma   . Hyperlipidemia     Social History   Social History  . Marital status: Married    Spouse name: N/A  . Number of children: N/A  . Years of education: N/A   Occupational History  . Not on file.   Social History Main Topics  . Smoking status: Never Smoker  .  Smokeless tobacco: Never Used  . Alcohol use 0.6 oz/week    1 Standard drinks or equivalent per week  . Drug use: No  . Sexual activity: Not on file   Other Topics Concern  . Not on file   Social History Narrative  . No narrative on file    Outpatient Encounter Prescriptions as of 12/31/2015  Medication Sig Note  . hydrochlorothiazide (HYDRODIURIL) 12.5 MG tablet TAKE 1/2 TABLET EVERY DAY   . levothyroxine (SYNTHROID, LEVOTHROID) 50 MCG tablet TAKE 1 TABLET EVERY DAY   . simvastatin (ZOCOR) 10 MG tablet Take 1 tablet (10 mg total) by mouth daily at 6 PM.   . timolol (TIMOPTIC) 0.5 % ophthalmic solution  04/11/2014: Received from: External Pharmacy   No facility-administered encounter medications on file as of 12/31/2015.     Allergies  Allergen Reactions  . Doxycycline   . Latex   .  Omeprazole Swelling  . Sulfa Antibiotics   . Pce  [Erythromycin] Rash    Review of Systems  Constitutional: Negative for chills, diaphoresis, fever, malaise/fatigue and weight loss.  Respiratory: Negative for cough, shortness of breath and wheezing.   Cardiovascular: Negative for chest pain, palpitations, orthopnea, claudication and leg swelling.  Gastrointestinal: Positive for abdominal pain and nausea. Negative for blood in stool, constipation, diarrhea, heartburn, melena and vomiting.  Musculoskeletal: Negative for falls.  Neurological: Negative for weakness.     Fall Risk  12/31/2015 12/31/2014  Falls in the past year? No No   Depression screen [facility] 2/9 12/31/2015 12/31/2014  Decreased Interest 0 0  Down, Depressed, Hopeless 0 0  PHQ - 2 Score 0 0   Functional Status Survey: Is the patient deaf or have difficulty hearing?: No Does the patient have difficulty seeing, even when wearing glasses/contacts?: No (Patient has gluacoma) Does the patient have difficulty concentrating, remembering, or making decisions?: No Does the patient have difficulty walking or climbing stairs?: No Does the patient have difficulty dressing or bathing?: No Does the patient have difficulty doing errands alone such as visiting a doctor's office or shopping?: No  Current Exercise Habits: The patient does not participate in regular exercise at present     Objective:  BP 132/68 (BP Location: Right Arm, Patient Position: Sitting, Cuff Size: Normal)   Pulse 80   Temp 97.6 F (36.4 C) (Oral)   Resp 16   Wt 170 lb (77.1 kg)   BMI 23.06 kg/m   Physical Exam  Constitutional: He is oriented to person, place, and time and well-developed, well-nourished, and in no distress.  HENT:  Head: Normocephalic and atraumatic.  Mild head tremor  Eyes: Conjunctivae are normal. Pupils are equal, round, and reactive to light.  Mild nystagmus to the left  Neck: Normal range of motion.  Cardiovascular: Normal rate,  regular rhythm and normal heart sounds.   Pulmonary/Chest: Effort normal and breath sounds normal.  Abdominal: Soft. Bowel sounds are normal.  Musculoskeletal: He exhibits no edema.  Neurological: He is alert and oriented to person, place, and time. No cranial nerve deficit. He exhibits normal muscle tone. Gait normal. Coordination normal. GCS score is 15.  Mild head tremor. Nystagmus with gaze to the left.  Skin: Skin is warm and dry.  Psychiatric: Mood, memory, affect and judgment normal.    Assessment and Plan :   1. Essential (primary) hypertension  - CBC with Differential/Platelet - Comprehensive metabolic panel - TSH  2. Adult hypothyroidism  - TSH  3. Hyperlipidemia, unspecified hyperlipidemia type  -  Lipid Panel With LDL/HDL Ratio  4. Vertigo  - ranitidine (ZANTAC) 150 MG capsule; Take 1 capsule (150 mg total) by mouth 2 (two) times daily.  Dispense: 60 capsule; Refill: 12  5. Gastroesophageal reflux disease, esophagitis presence not specified Patient is specifically instructed to avoid NSAID, including OTC meds - ranitidine (ZANTAC) 150 MG capsule; Take 1 capsule (150 mg total) by mouth 2 (two) times daily.  Dispense: 60 capsule; Refill: 12 6. Head tremor I think this is benign tremor. No treatment for now.  HPI, Exam and A&P Transcribed under the direction and in the presence of Wilhemena Durie., MD. Electronically Signed: Althea Charon, [place]

## 2016-01-01 ENCOUNTER — Ambulatory Visit: Payer: Commercial Managed Care - HMO | Admitting: Family Medicine

## 2016-01-01 ENCOUNTER — Telehealth: Payer: Self-pay

## 2016-01-01 ENCOUNTER — Ambulatory Visit: Payer: Commercial Managed Care - HMO

## 2016-01-01 LAB — LIPID PANEL WITH LDL/HDL RATIO
Cholesterol, Total: 177 mg/dL (ref 100–199)
HDL: 57 mg/dL (ref >39)
LDL CALC: 94 mg/dL (ref 0–99)
LDL/HDL RATIO: 1.6 ratio (ref 0.0–3.6)
Triglycerides: 132 mg/dL (ref 0–149)
VLDL Cholesterol Cal: 26 mg/dL (ref 5–40)

## 2016-01-01 LAB — COMPREHENSIVE METABOLIC PANEL
A/G RATIO: 1.5 (ref 1.2–2.2)
ALBUMIN: 4.3 g/dL (ref 3.5–4.7)
ALT: 18 IU/L (ref 0–44)
AST: 19 IU/L (ref 0–40)
Alkaline Phosphatase: 78 IU/L (ref 39–117)
BUN / CREAT RATIO: 17 (ref 10–24)
BUN: 20 mg/dL (ref 8–27)
Bilirubin Total: 0.8 mg/dL (ref 0.0–1.2)
CALCIUM: 9.5 mg/dL (ref 8.6–10.2)
CO2: 26 mmol/L (ref 18–29)
CREATININE: 1.17 mg/dL (ref 0.76–1.27)
Chloride: 100 mmol/L (ref 96–106)
GFR calc Af Amer: 64 mL/min/{1.73_m2} (ref >59)
GFR, EST NON AFRICAN AMERICAN: 55 mL/min/{1.73_m2} — AB (ref >59)
GLOBULIN, TOTAL: 2.8 g/dL (ref 1.5–4.5)
Glucose: 100 mg/dL — ABNORMAL HIGH (ref 65–99)
POTASSIUM: 4.4 mmol/L (ref 3.5–5.2)
SODIUM: 140 mmol/L (ref 134–144)
Total Protein: 7.1 g/dL (ref 6.0–8.5)

## 2016-01-01 LAB — CBC WITH DIFFERENTIAL/PLATELET
BASOS: 0 %
Basophils Absolute: 0 10*3/uL (ref 0.0–0.2)
EOS (ABSOLUTE): 0.3 10*3/uL (ref 0.0–0.4)
EOS: 5 %
HEMATOCRIT: 47 % (ref 37.5–51.0)
HEMOGLOBIN: 16.5 g/dL (ref 13.0–17.7)
IMMATURE GRANULOCYTES: 0 %
Immature Grans (Abs): 0 10*3/uL (ref 0.0–0.1)
LYMPHS ABS: 2.2 10*3/uL (ref 0.7–3.1)
Lymphs: 31 %
MCH: 30.8 pg (ref 26.6–33.0)
MCHC: 35.1 g/dL (ref 31.5–35.7)
MCV: 88 fL (ref 79–97)
MONOCYTES: 9 %
MONOS ABS: 0.6 10*3/uL (ref 0.1–0.9)
Neutrophils Absolute: 3.8 10*3/uL (ref 1.4–7.0)
Neutrophils: 55 %
Platelets: 298 10*3/uL (ref 150–379)
RBC: 5.35 x10E6/uL (ref 4.14–5.80)
RDW: 13.9 % (ref 12.3–15.4)
WBC: 7 10*3/uL (ref 3.4–10.8)

## 2016-01-01 LAB — TSH: TSH: 4.67 u[IU]/mL — AB (ref 0.450–4.500)

## 2016-01-01 NOTE — Telephone Encounter (Signed)
-----   Message from Jerrol Banana., MD sent at 01/01/2016  8:16 AM EST ----- Labs stable.

## 2016-01-01 NOTE — Telephone Encounter (Signed)
[id number] 

## 2016-01-01 NOTE — Telephone Encounter (Signed)
Patient has been advised. KW 

## 2016-01-02 ENCOUNTER — Ambulatory Visit: Payer: Commercial Managed Care - HMO

## 2016-01-07 ENCOUNTER — Telehealth: Payer: Self-pay | Admitting: Family Medicine

## 2016-01-07 NOTE — Telephone Encounter (Signed)
???--  if resolved then nio meds--if symtomatic try sucralfate 1 gram 4 times a day until next visit.It is not absorbed by body.

## 2016-01-07 NOTE — Telephone Encounter (Signed)
Pt's wife is requesting a nurse to return her call to discuss pt taking ranitidine (ZANTAC) 150 MG capsule. Pt got on the phone and stated he started taking the medication about a week ago and about 3 days after starting the medication he started having heartburn and knee pain. Pt stated that on Sunday 01/05/16 he started having stomach pain and he hasn't taken the medication since Saturday 01/04/16 and all 3 symptoms have improved since stopping the medication. Pt and his wife would like a call back to be advised what he should try next. [facility] [address] [address]. Please advise. Thanks TNP

## 2016-01-07 NOTE — Telephone Encounter (Signed)
Patient advised as below. Patient reports his symptoms have resolved and will call back if symptoms come back to start sucralfate.

## 2016-01-17 ENCOUNTER — Other Ambulatory Visit: Payer: Self-pay | Admitting: Family Medicine

## 2016-01-28 ENCOUNTER — Ambulatory Visit (INDEPENDENT_AMBULATORY_CARE_PROVIDER_SITE_OTHER): Payer: Medicare HMO | Admitting: Family Medicine

## 2016-01-28 VITALS — BP 138/72 | HR 82 | Temp 97.7°F | Resp 16 | Wt 170.0 lb

## 2016-01-28 DIAGNOSIS — I10 Essential (primary) hypertension: Secondary | ICD-10-CM | POA: Diagnosis not present

## 2016-01-28 DIAGNOSIS — K219 Gastro-esophageal reflux disease without esophagitis: Secondary | ICD-10-CM | POA: Diagnosis not present

## 2016-01-28 DIAGNOSIS — R110 Nausea: Secondary | ICD-10-CM | POA: Diagnosis not present

## 2016-01-28 DIAGNOSIS — E039 Hypothyroidism, unspecified: Secondary | ICD-10-CM | POA: Diagnosis not present

## 2016-01-28 NOTE — Progress Notes (Signed)
[place].  MRN: [id number] DOB: 10-17-1924  Subjective:  HPI  Patient is here for follow up from last visit on 12/31/15. Treated his symptoms at that were treated as GERD with Zantac. On 01/07/16 received message from patient that he was having heartburn, abdominal pain and knee pain and they have stopped medication and symptoms resolved. He is feeling 85-90% better. Pain he was having inth abdomen resolved, symptoms overall mostly resolved. Not having nausea like before and feeling discomfort in the stomach. Bowels have been ok.  Routine labs were done on 12/31/15 Patient Active Problem List   Diagnosis Date Noted  . Benign fibroma of prostate 11/28/2014  . Colon polyp 11/28/2014  . Essential (primary) hypertension 11/28/2014  . Bergmann's syndrome 11/28/2014  . HLD (hyperlipidemia) 11/28/2014  . Adult hypothyroidism 11/28/2014  . Esophagitis, reflux 11/28/2014  . Has a tremor 11/28/2014  . Glaucoma 04/19/2014  . Personal history of colonic polyps 09/28/2013  . History of colon polyps 09/28/2013    Past Medical History:  Diagnosis Date  . Cancer ([place])    PROSTATE  . Glaucoma   . Hyperlipidemia     Social History   Social History  . Marital status: Married    Spouse name: N/A  . Number of children: N/A  . Years of education: N/A   Occupational History  . Not on file.   Social History Main Topics  . Smoking status: Never Smoker  . Smokeless tobacco: Never Used  . Alcohol use 0.6 oz/week    1 Standard drinks or equivalent per week  . Drug use: No  . Sexual activity: Not on file   Other Topics Concern  . Not on file   Social History Narrative  . No narrative on file    Outpatient Encounter Prescriptions as of 01/28/2016  Medication Sig Note  . hydrochlorothiazide (HYDRODIURIL) 12.5 MG tablet TAKE 1/2 TABLET EVERY DAY   . levothyroxine (SYNTHROID, LEVOTHROID) 50 MCG tablet TAKE 1 TABLET EVERY DAY   . simvastatin (ZOCOR) 10 MG tablet TAKE 1  TABLET  DAILY AT 6 PM.   . timolol (TIMOPTIC) 0.5 % ophthalmic solution  04/11/2014: Received from: External Pharmacy  . FLUZONE HIGH-DOSE 0.5 ML SUSY  01/28/2016: Received from: External Pharmacy  . [DISCONTINUED] ranitidine (ZANTAC) 150 MG capsule Take 1 capsule (150 mg total) by mouth 2 (two) times daily.    No facility-administered encounter medications on file as of 01/28/2016.     Allergies  Allergen Reactions  . Doxycycline   . Latex   . Omeprazole Swelling  . Sulfa Antibiotics   . Pce  [Erythromycin] Rash    Review of Systems  Constitutional: Negative.   Respiratory: Negative.   Cardiovascular: Negative.   Gastrointestinal:       Nausea and stomach discomfort is better  Neurological: Negative.   Endo/Heme/Allergies: Negative.   Psychiatric/Behavioral: Negative.     Objective:  BP 138/72   Pulse 82   Temp 97.7 F (36.5 C)   Resp 16   Wt 170 lb (77.1 kg)   BMI 23.06 kg/m   Physical Exam  Constitutional: He is oriented to person, place, and time and well-developed, well-nourished, and in no distress.  HENT:  Head: Normocephalic and atraumatic.  Right Ear: External ear normal.  Left Ear: External ear normal.  Nose: Nose normal.  Eyes: Conjunctivae are normal. Pupils are equal, round, and reactive to light.  Neck: Normal range of motion. Neck supple.  Cardiovascular: Normal rate, regular rhythm,  normal heart sounds and intact distal pulses.   No murmur heard. Pulmonary/Chest: Effort normal and breath sounds normal.  Abdominal: Soft. There is no tenderness. There is no rebound.  Neurological: He is alert and oriented to person, place, and time.  Skin: Skin is warm and dry.  Psychiatric: Mood, memory, affect and judgment normal.    Assessment and Plan :  1. Nausea Resolved. Symptoms improved off any medications. No further workup in this 83 year old. If symptoms return may need to evaluate with GI referral. 2. Gastroesophageal reflux disease, esophagitis  presence not specified Symptoms have resolved. Did not tolerate Zantac. May need to get further work up as needed.  3. Adult hypothyroidism  4. Essential (primary) hypertension Stable.  HPI, Exam and A&P transcribed under direction and in the presence of Miguel Aschoff, MD. I have done the exam and reviewed the chart and it is accurate to the best of my knowledge. Development worker, community has been used and  any errors in dictation or transcription are unintentional. Miguel Aschoff M.D. [facility]

## 2016-02-13 ENCOUNTER — Encounter: Payer: Self-pay | Admitting: *Deleted

## 2016-02-13 ENCOUNTER — Emergency Department: Payer: Medicare HMO

## 2016-02-13 ENCOUNTER — Emergency Department
Admission: EM | Admit: 2016-02-13 | Discharge: 2016-02-14 | Disposition: A | Discharge status: Home / Self Care | Payer: Medicare HMO | Attending: Emergency Medicine | Admitting: Emergency Medicine

## 2016-02-13 DIAGNOSIS — R05 Cough: Secondary | ICD-10-CM | POA: Diagnosis not present

## 2016-02-13 DIAGNOSIS — R221 Localized swelling, mass and lump, neck: Secondary | ICD-10-CM | POA: Diagnosis not present

## 2016-02-13 DIAGNOSIS — R220 Localized swelling, mass and lump, head: Secondary | ICD-10-CM | POA: Diagnosis present

## 2016-02-13 DIAGNOSIS — T783XXA Angioneurotic edema, initial encounter: Secondary | ICD-10-CM | POA: Insufficient documentation

## 2016-02-13 DIAGNOSIS — Z79899 Other long term (current) drug therapy: Secondary | ICD-10-CM | POA: Diagnosis not present

## 2016-02-13 DIAGNOSIS — I10 Essential (primary) hypertension: Secondary | ICD-10-CM | POA: Insufficient documentation

## 2016-02-13 HISTORY — DX: Essential (primary) hypertension: I10

## 2016-02-13 LAB — COMPREHENSIVE METABOLIC PANEL
ALBUMIN: 3.6 g/dL (ref 3.5–5.0)
ALK PHOS: 71 U/L (ref 38–126)
ALT: 14 U/L — AB (ref 17–63)
AST: 21 U/L (ref 15–41)
Anion gap: 8 (ref 5–15)
BUN: 18 mg/dL (ref 6–20)
CALCIUM: 8.5 mg/dL — AB (ref 8.9–10.3)
CO2: 27 mmol/L (ref 22–32)
CREATININE: 1.25 mg/dL — AB (ref 0.61–1.24)
Chloride: 102 mmol/L (ref 101–111)
GFR calc non Af Amer: 49 mL/min — ABNORMAL LOW (ref >60)
GFR, EST AFRICAN AMERICAN: 57 mL/min — AB (ref >60)
GLUCOSE: 124 mg/dL — AB (ref 65–99)
Potassium: 3.5 mmol/L (ref 3.5–5.1)
SODIUM: 137 mmol/L (ref 135–145)
Total Bilirubin: 0.6 mg/dL (ref 0.3–1.2)
Total Protein: 7.1 g/dL (ref 6.5–8.1)

## 2016-02-13 LAB — CBC WITH DIFFERENTIAL/PLATELET
BASOS ABS: 0 10*3/uL (ref 0–0.1)
BASOS PCT: 1 %
Eosinophils Absolute: 0.4 10*3/uL (ref 0–0.7)
Eosinophils Relative: 5 %
HEMATOCRIT: 44.7 % (ref 40.0–52.0)
HEMOGLOBIN: 15.4 g/dL (ref 13.0–18.0)
LYMPHS PCT: 23 %
Lymphs Abs: 2.2 10*3/uL (ref 1.0–3.6)
MCH: 31 pg (ref 26.0–34.0)
MCHC: 34.4 g/dL (ref 32.0–36.0)
MCV: 90 fL (ref 80.0–100.0)
MONO ABS: 1 10*3/uL (ref 0.2–1.0)
Monocytes Relative: 11 %
NEUTROS ABS: 6 10*3/uL (ref 1.4–6.5)
NEUTROS PCT: 62 %
Platelets: 323 10*3/uL (ref 150–440)
RBC: 4.96 MIL/uL (ref 4.40–5.90)
RDW: 13.3 % (ref 11.5–14.5)
WBC: 9.7 10*3/uL (ref 3.8–10.6)

## 2016-02-13 LAB — TROPONIN I: Troponin I: <0.03 ng/mL (ref <0.03)

## 2016-02-13 MED ORDER — DIPHENHYDRAMINE HCL 50 MG/ML IJ SOLN
25.0000 mg | Freq: Once | INTRAMUSCULAR | Status: AC
  Administered 2016-02-13: 25 mg via INTRAVENOUS
  Filled 2016-02-13: qty 1

## 2016-02-13 MED ORDER — DEXAMETHASONE SODIUM PHOSPHATE 10 MG/ML IJ SOLN
10.0000 mg | Freq: Once | INTRAMUSCULAR | Status: AC
  Administered 2016-02-13: 10 mg via INTRAVENOUS
  Filled 2016-02-13: qty 1

## 2016-02-13 MED ORDER — IOPAMIDOL (ISOVUE-300) INJECTION 61%
75.0000 mL | Freq: Once | INTRAVENOUS | Status: AC | PRN
  Administered 2016-02-13: 75 mL via INTRAVENOUS

## 2016-02-13 NOTE — ED Provider Notes (Signed)
[facility] Provider Note   CSN: [id number] Arrival date & time: 02/13/16  2028     History   Chief Complaint Chief Complaint  Patient presents with  . Oral Swelling    HPI Grantly Shaut Aevin Bernheim. is a 83 y.o. male hx of prostate cancer, HL, HTN Here presenting with tongue swelling. Patient states that he may have bit the left side of his tongue 2 hours prior to arrival. States that his tongue swelled up right away and did not get better. Patient denies any lip swelling and denies any trouble swallowing. Patient states that this has happened to him before after he bit his tongue but usually goes away. Patient denies being on ACE inhibitor and is just on hydrochlorothiazide for blood pressure. She has no recent medication changes.     The history is provided by the patient.    Past Medical History:  Diagnosis Date  . Cancer ([place])    PROSTATE  . Glaucoma   . Hyperlipidemia   . Hypertension     Patient Active Problem List   Diagnosis Date Noted  . Benign fibroma of prostate 11/28/2014  . Colon polyp 11/28/2014  . Essential (primary) hypertension 11/28/2014  . Bergmann's syndrome 11/28/2014  . HLD (hyperlipidemia) 11/28/2014  . Adult hypothyroidism 11/28/2014  . Esophagitis, reflux 11/28/2014  . Has a tremor 11/28/2014  . Glaucoma 04/19/2014  . Personal history of colonic polyps 09/28/2013  . History of colon polyps 09/28/2013    Past Surgical History:  Procedure Laterality Date  . CATARACT EXTRACTION  2012, 2013  . COLON RESECTION  2009  . COLONOSCOPY  09-14-08,09/2013.   Dr Jamal Collin  . COLONOSCOPY WITH PROPOFOL N/A 10/31/2014   Procedure: COLONOSCOPY WITH PROPOFOL;  Surgeon: Christene Lye, MD;  Location: [facility] ENDOSCOPY;  Service: Endoscopy;  Laterality: N/A;  . [place] Medications    Prior to Admission medications   Medication Sig Start Date End Date Taking? Authorizing Provider  hydrochlorothiazide  (HYDRODIURIL) 12.5 MG tablet TAKE 1/2 TABLET EVERY DAY 11/04/15  Yes Richard Maceo Pro., MD  levothyroxine (SYNTHROID, LEVOTHROID) 50 MCG tablet TAKE 1 TABLET EVERY DAY 06/19/15  Yes Jerrol Banana., MD  simvastatin (ZOCOR) 10 MG tablet TAKE 1 TABLET  DAILY AT 6 PM. 01/17/16  Yes Jerrol Banana., MD  timolol (TIMOPTIC) 0.5 % ophthalmic solution Place 1 drop into both eyes 2 (two) times daily.    Yes Historical Provider, MD    Family History Family History  Problem Relation Age of Onset  . Stroke Mother   . Colon cancer Father   . Colon cancer Sister   . Lung cancer Sister   . Lung cancer Brother   . Diabetes Sister   . Heart disease Sister   . Heart disease Sister     Social History Social History  Substance Use Topics  . Smoking status: Never Smoker  . Smokeless tobacco: Never Used  . Alcohol use 0.6 oz/week    1 Standard drinks or equivalent per week     Allergies   Doxycycline; Latex; Omeprazole; Ranitidine; Sulfa antibiotics; and Pce  [erythromycin]   Review of Systems Review of Systems  HENT:       Tongue swelling   All other systems reviewed and are negative.    Physical Exam Updated Vital Signs BP (!) 189/84 (BP Location: Left Arm)   Pulse 92   Temp 98.5 F (36.9 C) (Oral)  Resp 20   Ht 6' (1.829 m)   Wt 170 lb (77.1 kg)   SpO2 97%   BMI 23.06 kg/m   Physical Exam  Constitutional: He is oriented to person, place, and time.  Chronically ill, slightly uncomfortable   HENT:  Head: Normocephalic.  L side of the tongue swollen, floor of mouth soft and not firm. No obvious tongue laceration. No obvious cervical LAD   Eyes: EOM are normal. Pupils are equal, round, and reactive to light.  Cardiovascular: Normal rate, regular rhythm and normal heart sounds.   Pulmonary/Chest: Effort normal and breath sounds normal. No respiratory distress. He has no wheezes.  Abdominal: Soft. Bowel sounds are normal. He exhibits no distension. There is no  tenderness. There is no guarding.  Musculoskeletal: Normal range of motion.  Neurological: He is alert and oriented to person, place, and time. He displays normal reflexes. No cranial nerve deficit. Coordination normal.  Skin: Skin is warm.  Psychiatric: He has a normal mood and affect.  Nursing note and vitals reviewed.    ED Treatments / Results  Labs (all labs ordered are listed, but only abnormal results are displayed) Labs Reviewed  COMPREHENSIVE METABOLIC PANEL - Abnormal; Notable for the following:       Result Value   Glucose, Bld 124 (*)    Creatinine, Ser 1.25 (*)    Calcium 8.5 (*)    ALT 14 (*)    GFR calc non Af Amer 49 (*)    GFR calc Af Amer 57 (*)    All other components within normal limits  CBC WITH DIFFERENTIAL/PLATELET  TROPONIN I    EKG  EKG Interpretation None       ED ECG REPORT I, Wandra Arthurs, the attending physician, personally viewed and interpreted this ECG.   Date: 02/13/2016  EKG Time: 21:34 pm  Rate: 87  Rhythm: normal EKG, normal sinus rhythm  Axis: normal  Intervals:none  ST&T Change: nonspecific    Radiology Dg Chest 2 View  Result Date: 02/13/2016 CLINICAL DATA:  83 year old male with cough. Patient has tongue swelling for 1 hour. EXAM: CHEST  2 VIEW COMPARISON:  Chest radiograph dated 04/19/2012 FINDINGS: The lungs are clear. Mild hyperexpansion of the lungs may be related to underlying mild COPD. There is no pleural effusion or pneumothorax. The cardiac silhouette is within normal limits. There is mild pectus excavatum deformity. Osteopenia with mild degenerative changes of the spine. No acute osseous pathology. IMPRESSION: No active cardiopulmonary disease. Electronically Signed   By: Anner Crete M.D.   On: 02/13/2016 21:41    Procedures Procedures (including critical care time)  Medications Ordered in ED Medications  dexamethasone (DECADRON) injection 10 mg (10 mg Intravenous Given 02/13/16 2127)  diphenhydrAMINE  (BENADRYL) injection 25 mg (25 mg Intravenous Given 02/13/16 2127)  iopamidol (ISOVUE-300) 61 % injection 75 mL (75 mLs Intravenous Contrast Given 02/13/16 2307)     Initial Impression / Assessment and Plan / ED Course  I have reviewed the triage vital signs and the nursing notes.  Pertinent labs & imaging results that were available during my care of the patient were reviewed by me and considered in my medical decision making (see chart for details).     Zada Girt Rahman Agosta. is a 83 y.o. male here with L tongue swelling. Maybe swelling from biting his tongue vs early ludwig. Floor of mouth is soft but swollen. No angioedema and not on ACEI. Will give decadron. Will get CT neck.  11:26 PM Labs unremarkable. CT neck pending. Signed out to Dr. Owens Shark.    Final Clinical Impressions(s) / ED Diagnoses   Final diagnoses:  None    New Prescriptions New Prescriptions   No medications on file     Drenda Freeze, MD 02/13/16 2327

## 2016-02-13 NOTE — ED Notes (Signed)
Patient transported to X-ray 

## 2016-02-13 NOTE — ED Notes (Signed)
Patient transported to CT 

## 2016-02-13 NOTE — ED Triage Notes (Signed)
Pt has tongue swelling x 1 hour.  Pt has diff talking.  No resp distress. Pt alert.

## 2016-02-14 MED ORDER — PREDNISONE 20 MG PO TABS: 60.0000 mg | ORAL_TABLET | Freq: Every day | ORAL | 0 refills | Status: AC

## 2016-03-10 DIAGNOSIS — X32XXXA Exposure to sunlight, initial encounter: Secondary | ICD-10-CM | POA: Diagnosis not present

## 2016-03-10 DIAGNOSIS — Z85828 Personal history of other malignant neoplasm of skin: Secondary | ICD-10-CM | POA: Diagnosis not present

## 2016-03-10 DIAGNOSIS — L570 Actinic keratosis: Secondary | ICD-10-CM | POA: Diagnosis not present

## 2016-03-10 DIAGNOSIS — Z08 Encounter for follow-up examination after completed treatment for malignant neoplasm: Secondary | ICD-10-CM | POA: Diagnosis not present

## 2016-05-05 ENCOUNTER — Ambulatory Visit (INDEPENDENT_AMBULATORY_CARE_PROVIDER_SITE_OTHER): Payer: Medicare HMO | Admitting: Family Medicine

## 2016-05-05 ENCOUNTER — Encounter: Payer: Self-pay | Admitting: Family Medicine

## 2016-05-05 VITALS — BP 148/70 | HR 72 | Temp 97.6°F | Resp 16 | Wt 171.0 lb

## 2016-05-05 DIAGNOSIS — K649 Unspecified hemorrhoids: Secondary | ICD-10-CM | POA: Diagnosis not present

## 2016-05-05 DIAGNOSIS — R600 Localized edema: Secondary | ICD-10-CM | POA: Diagnosis not present

## 2016-05-05 DIAGNOSIS — R079 Chest pain, unspecified: Secondary | ICD-10-CM

## 2016-05-05 NOTE — Progress Notes (Signed)
Patient: Jeffrey Hendrix. Male    DOB: 06-09-1924   83 y.o.   MRN: [id number] Visit Date: 05/05/2016  Today's Provider: Wilhemena Durie, MD   Chief Complaint  Patient presents with  . Edema   Subjective:    HPI    Edema: Patient complains of edema. The location of the edema is lower leg(s) bilateral, ankle(s) bilateral, feet bilateral.  The edema has been mild to moderate.  Onset of symptoms was 2 weeks ago, gradually improving since that time. The edema is present all day. The patient states 2 years ago.  The swelling has been associated with intermittent chest pain over the last several years. Chest pain lasts anywhere for 1-2 seconds up to several minutes. Pain can be a little sharp pain, pain is also "a pressure". Denies SOB, palpitations, N/V, arm pain. Pain occurs while sitting or laying down. Denies orthopnea.. Cardiac risk factors include advanced age (older than 25 for men, 5 for women), dyslipidemia and hypertension. He also c/o hemorrhoids which have not been a major issue in the past.   Allergies  Allergen Reactions  . Doxycycline   . Latex   . Omeprazole Swelling  . Ranitidine     Developed bad heartburn and stomach pain  . Sulfa Antibiotics   . Pce  [Erythromycin] Rash     Current Outpatient Prescriptions:  .  hydrochlorothiazide (HYDRODIURIL) 12.5 MG tablet, TAKE 1/2 TABLET EVERY DAY, Disp: 45 tablet, Rfl: 3 .  levothyroxine (SYNTHROID, LEVOTHROID) 50 MCG tablet, TAKE 1 TABLET EVERY DAY, Disp: 90 tablet, Rfl: 3 .  simvastatin (ZOCOR) 10 MG tablet, TAKE 1 TABLET  DAILY AT 6 PM., Disp: 90 tablet, Rfl: 3 .  timolol (TIMOPTIC) 0.5 % ophthalmic solution, Place 1 drop into both eyes 2 (two) times daily. , Disp: , Rfl:   Review of Systems  Constitutional: Negative for activity change, appetite change, chills, diaphoresis, fatigue, fever and unexpected weight change.  Eyes: Negative.   Respiratory: Negative for shortness of breath.   Cardiovascular:  Positive for chest pain and leg swelling. Negative for palpitations.  Gastrointestinal: Negative for nausea and vomiting.       Hemorrhoid present  Endocrine: Negative.   Allergic/Immunologic: Negative.   Neurological: Negative.   Psychiatric/Behavioral: Negative.     Social History  Substance Use Topics  . Smoking status: Never Smoker  . Smokeless tobacco: Never Used  . Alcohol use 0.6 oz/week    1 Standard drinks or equivalent per week   Objective:   BP (!) 148/70 (BP Location: Right Arm, Patient Position: Sitting, Cuff Size: Normal)   Pulse 72   Temp 97.6 F (36.4 C) (Oral)   Resp 16   Wt 171 lb (77.6 kg)   SpO2 96%   BMI 23.19 kg/m  Vitals:   05/05/16 0956  BP: (!) 148/70  Pulse: 72  Resp: 16  Temp: 97.6 F (36.4 C)  TempSrc: Oral  SpO2: 96%  Weight: 171 lb (77.6 kg)     Physical Exam  Constitutional: He is oriented to person, place, and time. He appears well-developed and well-nourished.  HENT:  Head: Normocephalic and atraumatic.  Right Ear: External ear normal.  Left Ear: External ear normal.  Eyes: Conjunctivae are normal.  Neck: Normal range of motion. Neck supple. No thyromegaly present.  Cardiovascular: Normal rate, regular rhythm and normal heart sounds.   Pulmonary/Chest: Effort normal and breath sounds normal. No respiratory distress. He exhibits no tenderness.  Abdominal: Soft.  There is no tenderness.  Femoral pulses normal  Genitourinary:  Genitourinary Comments: Hemorrhoidal tag present, not thrombosed.  Musculoskeletal: Normal range of motion. He exhibits no edema.  Right calf measures 14 inches, left calf is 14 inches.  Neurological: He is alert and oriented to person, place, and time.  Psychiatric: He has a normal mood and affect. His behavior is normal. Judgment and thought content normal.        Assessment & Plan:     1. Bilateral lower extremity edema Check labs to R/O CHF. Pt declines CXR and cardiology referral. FU pending lab  results. - CBC with Differential/Platelet - TSH - Comprehensive metabolic panel - Brain natriuretic peptide - Troponin I  2. Chest pain, unspecified type I do not think this is CAD or CHF. - EKG 12-Lead - CBC with Differential/Platelet - TSH - Comprehensive metabolic panel - Brain natriuretic peptide - Troponin I    3. Hemorrhoids, unspecified hemorrhoid type Pt will use hot baths to improve sx for the next 1-2 weeks. Pt will call back if sx do not improve. Will consider surgery referral at that time.    Patient seen and examined by Miguel Aschoff, MD, and note scribed by Renaldo Fiddler, CMA.  Avonlea Sima Cranford Mon, MD  [facility]

## 2016-05-05 NOTE — Patient Instructions (Signed)
Use hot baths to calm hemorrhoid symptoms for the next week or two. Call us if there is no improvement, and we will consider a surgery referral at that time.

## 2016-05-06 LAB — TROPONIN I

## 2016-05-06 LAB — CBC WITH DIFFERENTIAL/PLATELET
BASOS ABS: 0 10*3/uL (ref 0.0–0.2)
Basos: 1 %
EOS (ABSOLUTE): 0.2 10*3/uL (ref 0.0–0.4)
Eos: 4 %
HEMOGLOBIN: 16.4 g/dL (ref 13.0–17.7)
Hematocrit: 48.4 % (ref 37.5–51.0)
IMMATURE GRANS (ABS): 0 10*3/uL (ref 0.0–0.1)
Immature Granulocytes: 0 %
LYMPHS: 35 %
Lymphocytes Absolute: 2.1 10*3/uL (ref 0.7–3.1)
MCH: 30.6 pg (ref 26.6–33.0)
MCHC: 33.9 g/dL (ref 31.5–35.7)
MCV: 90 fL (ref 79–97)
MONOCYTES: 10 %
Monocytes Absolute: 0.6 10*3/uL (ref 0.1–0.9)
NEUTROS ABS: 3.1 10*3/uL (ref 1.4–7.0)
NEUTROS PCT: 50 %
PLATELETS: 259 10*3/uL (ref 150–379)
RBC: 5.36 x10E6/uL (ref 4.14–5.80)
RDW: 14.7 % (ref 12.3–15.4)
WBC: 6 10*3/uL (ref 3.4–10.8)

## 2016-05-06 LAB — COMPREHENSIVE METABOLIC PANEL
A/G RATIO: 1.8 (ref 1.2–2.2)
ALT: 14 IU/L (ref 0–44)
AST: 20 IU/L (ref 0–40)
Albumin: 4.4 g/dL (ref 3.5–4.7)
Alkaline Phosphatase: 73 IU/L (ref 39–117)
BUN/Creatinine Ratio: 13 (ref 10–24)
BUN: 13 mg/dL (ref 8–27)
Bilirubin Total: 0.9 mg/dL (ref 0.0–1.2)
CALCIUM: 9.4 mg/dL (ref 8.6–10.2)
CO2: 24 mmol/L (ref 18–29)
CREATININE: 1.04 mg/dL (ref 0.76–1.27)
Chloride: 100 mmol/L (ref 96–106)
GFR, EST AFRICAN AMERICAN: 73 mL/min/{1.73_m2} (ref >59)
GFR, EST NON AFRICAN AMERICAN: 63 mL/min/{1.73_m2} (ref >59)
GLUCOSE: 101 mg/dL — AB (ref 65–99)
Globulin, Total: 2.5 g/dL (ref 1.5–4.5)
Potassium: 4.6 mmol/L (ref 3.5–5.2)
Sodium: 140 mmol/L (ref 134–144)
TOTAL PROTEIN: 6.9 g/dL (ref 6.0–8.5)

## 2016-05-06 LAB — TSH: TSH: 2.87 u[IU]/mL (ref 0.450–4.500)

## 2016-05-06 LAB — BRAIN NATRIURETIC PEPTIDE: BNP: 64.2 pg/mL (ref 0.0–100.0)

## 2016-06-22 ENCOUNTER — Other Ambulatory Visit: Payer: Self-pay | Admitting: Family Medicine

## 2016-06-26 DIAGNOSIS — H401133 Primary open-angle glaucoma, bilateral, severe stage: Secondary | ICD-10-CM | POA: Diagnosis not present

## 2016-07-28 ENCOUNTER — Ambulatory Visit (INDEPENDENT_AMBULATORY_CARE_PROVIDER_SITE_OTHER): Payer: Medicare HMO | Admitting: Family Medicine

## 2016-07-28 VITALS — BP 160/82 | HR 76 | Temp 97.6°F | Resp 16 | Wt 170.0 lb

## 2016-07-28 DIAGNOSIS — I10 Essential (primary) hypertension: Secondary | ICD-10-CM

## 2016-07-28 DIAGNOSIS — I872 Venous insufficiency (chronic) (peripheral): Secondary | ICD-10-CM

## 2016-07-28 DIAGNOSIS — E039 Hypothyroidism, unspecified: Secondary | ICD-10-CM | POA: Diagnosis not present

## 2016-07-28 NOTE — Progress Notes (Signed)
[place].  MRN: [id number] DOB: February 14, 1924  Subjective:  HPI   The patient in an 83 year old male who presents for follow up of his edema.  He states that he was last seen on 05/05/16 and had edema in his feet, ankle and lower legs.  He states that he continues to have the edema and the only difference is that now it is more consistent.  He has no shortness of breath with the swelling and he said it does go down after sleeping but not all the way.  He continues to mow his lawn and states he does not get SOB during exertion either.  Patient Active Problem List   Diagnosis Date Noted  . Benign fibroma of prostate 11/28/2014  . Colon polyp 11/28/2014  . Essential (primary) hypertension 11/28/2014  . Bergmann's syndrome 11/28/2014  . HLD (hyperlipidemia) 11/28/2014  . Adult hypothyroidism 11/28/2014  . Esophagitis, reflux 11/28/2014  . Has a tremor 11/28/2014  . Glaucoma 04/19/2014  . Personal history of colonic polyps 09/28/2013  . History of colon polyps 09/28/2013    Past Medical History:  Diagnosis Date  . Cancer ([place])    PROSTATE  . Glaucoma   . Hyperlipidemia   . Hypertension     Social History   Social History  . Marital status: Married    Spouse name: N/A  . Number of children: N/A  . Years of education: N/A   Occupational History  . Not on file.   Social History Main Topics  . Smoking status: Never Smoker  . Smokeless tobacco: Never Used  . Alcohol use 0.6 oz/week    1 Standard drinks or equivalent per week  . Drug use: No  . Sexual activity: Not on file   Other Topics Concern  . Not on file   Social History Narrative  . No narrative on file    Outpatient Encounter Prescriptions as of 07/28/2016  Medication Sig  . hydrochlorothiazide (HYDRODIURIL) 12.5 MG tablet TAKE 1/2 TABLET EVERY DAY  . levothyroxine (SYNTHROID, LEVOTHROID) 50 MCG tablet TAKE 1 TABLET EVERY DAY  . simvastatin (ZOCOR) 10 MG tablet TAKE 1 TABLET  DAILY AT 6 PM.  .  timolol (TIMOPTIC) 0.5 % ophthalmic solution Place 1 drop into both eyes 2 (two) times daily.    No facility-administered encounter medications on file as of 07/28/2016.     Allergies  Allergen Reactions  . Doxycycline   . Latex   . Omeprazole Swelling  . Ranitidine     Developed bad heartburn and stomach pain  . Sulfa Antibiotics   . Pce  [Erythromycin] Rash    Review of Systems  Constitutional: Negative for fever and malaise/fatigue.  Eyes: Negative.   Respiratory: Negative for cough, shortness of breath and wheezing.   Cardiovascular: Negative for chest pain, palpitations, orthopnea and PND.       Swelling of the feet, ankles and calf.  Gastrointestinal: Negative.   Musculoskeletal: Negative.   Skin: Negative.   Neurological: Negative.  Negative for weakness.  Endo/Heme/Allergies: Negative.   Psychiatric/Behavioral: Negative.     Objective:  BP (!) 160/82 (BP Location: Right Arm, Patient Position: Sitting, Cuff Size: Normal)   Pulse 76   Temp 97.6 F (36.4 C) (Oral)   Resp 16   Wt 170 lb (77.1 kg)   BMI 23.06 kg/m   Physical Exam  Constitutional: He is oriented to person, place, and time and well-developed, well-nourished, and in no distress.  HENT:  Head:  Normocephalic and atraumatic.  Eyes: Pupils are equal, round, and reactive to light. Conjunctivae are normal. No scleral icterus.  Neck: Normal range of motion. No thyromegaly present.  Abdominal: Soft.  Musculoskeletal: He exhibits edema (trace, ankles only).  Minimal swelling of feet ankles. No significant LE edema. No cords.  Neurological: He is alert and oriented to person, place, and time. Gait normal. GCS score is 15.  Skin: Skin is warm and dry.  Psychiatric: Mood and affect normal.    Assessment and Plan :  Mild Venous Insufficiency Pt reassured. HTN Hypothyroid BPH  I have done the exam and reviewed the chart and it is accurate to the best of my knowledge. Development worker, community has been used and   any errors in dictation or transcription are unintentional. Miguel Aschoff M.D. [facility]

## 2016-10-06 ENCOUNTER — Other Ambulatory Visit: Payer: Self-pay | Admitting: Family Medicine

## 2016-10-28 ENCOUNTER — Ambulatory Visit (INDEPENDENT_AMBULATORY_CARE_PROVIDER_SITE_OTHER): Payer: Medicare HMO

## 2016-10-28 DIAGNOSIS — Z23 Encounter for immunization: Secondary | ICD-10-CM | POA: Diagnosis not present

## 2016-12-15 ENCOUNTER — Ambulatory Visit: Payer: Medicare HMO | Admitting: Family Medicine

## 2016-12-15 ENCOUNTER — Ambulatory Visit: Payer: Medicare HMO

## 2016-12-15 ENCOUNTER — Other Ambulatory Visit: Payer: Self-pay

## 2016-12-15 VITALS — BP 164/86 | HR 64 | Temp 97.5°F | Resp 14 | Wt 172.0 lb

## 2016-12-15 DIAGNOSIS — E039 Hypothyroidism, unspecified: Secondary | ICD-10-CM | POA: Diagnosis not present

## 2016-12-15 DIAGNOSIS — I10 Essential (primary) hypertension: Secondary | ICD-10-CM

## 2016-12-15 DIAGNOSIS — E785 Hyperlipidemia, unspecified: Secondary | ICD-10-CM | POA: Diagnosis not present

## 2016-12-15 LAB — CBC WITH DIFFERENTIAL/PLATELET
BASOS PCT: 0.8 %
Basophils Absolute: 42 cells/uL (ref 0–200)
EOS ABS: 291 {cells}/uL (ref 15–500)
Eosinophils Relative: 5.6 %
HCT: 46.7 % (ref 38.5–50.0)
Hemoglobin: 16.2 g/dL (ref 13.2–17.1)
Lymphs Abs: 1794 cells/uL (ref 850–3900)
MCH: 30.9 pg (ref 27.0–33.0)
MCHC: 34.7 g/dL (ref 32.0–36.0)
MCV: 89.1 fL (ref 80.0–100.0)
MONOS PCT: 10.3 %
MPV: 9.9 fL (ref 7.5–12.5)
Neutro Abs: 2538 cells/uL (ref 1500–7800)
Neutrophils Relative %: 48.8 %
Platelets: 297 10*3/uL (ref 140–400)
RBC: 5.24 10*6/uL (ref 4.20–5.80)
RDW: 12.8 % (ref 11.0–15.0)
TOTAL LYMPHOCYTE: 34.5 %
WBC mixed population: 536 cells/uL (ref 200–950)
WBC: 5.2 10*3/uL (ref 3.8–10.8)

## 2016-12-15 LAB — COMPLETE METABOLIC PANEL WITH GFR
AG Ratio: 1.6 (calc) (ref 1.0–2.5)
ALBUMIN MSPROF: 4.1 g/dL (ref 3.6–5.1)
ALT: 12 U/L (ref 9–46)
AST: 16 U/L (ref 10–35)
Alkaline phosphatase (APISO): 67 U/L (ref 40–115)
BUN / CREAT RATIO: 11 (calc) (ref 6–22)
BUN: 13 mg/dL (ref 7–25)
CALCIUM: 9.1 mg/dL (ref 8.6–10.3)
CHLORIDE: 102 mmol/L (ref 98–110)
CO2: 30 mmol/L (ref 20–32)
CREATININE: 1.19 mg/dL — AB (ref 0.70–1.11)
GFR, EST AFRICAN AMERICAN: 62 mL/min/{1.73_m2} (ref >=60)
GFR, Est Non African American: 53 mL/min/{1.73_m2} — ABNORMAL LOW (ref >=60)
GLUCOSE: 103 mg/dL — AB (ref 65–99)
Globulin: 2.6 g/dL (calc) (ref 1.9–3.7)
Potassium: 4.1 mmol/L (ref 3.5–5.3)
Sodium: 138 mmol/L (ref 135–146)
TOTAL PROTEIN: 6.7 g/dL (ref 6.1–8.1)
Total Bilirubin: 0.8 mg/dL (ref 0.2–1.2)

## 2016-12-15 LAB — LIPID PANEL
Cholesterol: 160 mg/dL (ref <200)
HDL: 55 mg/dL (ref >40)
LDL Cholesterol (Calc): 83 mg/dL (calc)
NON-HDL CHOLESTEROL (CALC): 105 mg/dL (ref <130)
TRIGLYCERIDES: 119 mg/dL (ref <150)
Total CHOL/HDL Ratio: 2.9 (calc) (ref <5.0)

## 2016-12-15 LAB — TSH: TSH: 3.19 m[IU]/L (ref 0.40–4.50)

## 2016-12-15 NOTE — Progress Notes (Signed)
[facility].  MRN: [id number] DOB: 03-12-1924  Subjective:  HPI   The patinet is a 83 year old male who presents for follow up.  He was last seen on 07/28/16 for mild venous insufficiency.  The patient was having mild lower extremity edema at the time and was concerned.  He reports that the swelling has improved and he no longer is having any problem.    He does need follow up today for his hypertension.  He reports that his reading when checked at home usually has systolic reading in the 671'I, and he is not sure of the diastolic reading. He said that he always has higher readings in medical facilities.  BP Readings from Last 3 Encounters:  12/15/16 (!) 164/86  07/28/16 (!) 160/82  05/05/16 (!) 148/70    The patient also reports having some mild LLQ discomfort every now and then.  He states he has history of diverticulosis and rectal polyp.  He said today he is not having any trouble with the pain.  He had colonoscopy 2 years ago and says that was told he was probably too old to have any more.   Patient has questioned when his last PSA was done.  He has history of having prostate surgery.    Patient Active Problem List   Diagnosis Date Noted  . Benign fibroma of prostate 11/28/2014  . Colon polyp 11/28/2014  . Essential (primary) hypertension 11/28/2014  . Bergmann's syndrome 11/28/2014  . HLD (hyperlipidemia) 11/28/2014  . Adult hypothyroidism 11/28/2014  . Esophagitis, reflux 11/28/2014  . Has a tremor 11/28/2014  . Glaucoma 04/19/2014  . Personal history of colonic polyps 09/28/2013  . History of colon polyps 09/28/2013    Past Medical History:  Diagnosis Date  . Cancer ([place])    PROSTATE  . Glaucoma   . Hyperlipidemia   . Hypertension     Social History   Socioeconomic History  . Marital status: Married    Spouse name: Not on file  . Number of children: Not on file  . Years of education: Not on file  . Highest education level: Not on file  Social  Needs  . Financial resource strain: Not on file  . Food insecurity - worry: Not on file  . Food insecurity - inability: Not on file  . Transportation needs - medical: Not on file  . Transportation needs - non-medical: Not on file  Occupational History  . Not on file  Tobacco Use  . Smoking status: Never Smoker  . Smokeless tobacco: Never Used  Substance and Sexual Activity  . Alcohol use: Yes    Alcohol/week: 0.6 oz    Types: 1 Standard drinks or equivalent per week  . Drug use: No  . Sexual activity: Not on file  Other Topics Concern  . Not on file  Social History Narrative  . Not on file    Outpatient Encounter Medications as of 12/15/2016  Medication Sig  . hydrochlorothiazide (HYDRODIURIL) 12.5 MG tablet TAKE 1/2 TABLET EVERY DAY  . levothyroxine (SYNTHROID, LEVOTHROID) 50 MCG tablet TAKE 1 TABLET EVERY DAY  . simvastatin (ZOCOR) 10 MG tablet TAKE 1 TABLET  DAILY AT 6 PM.  . timolol (TIMOPTIC) 0.5 % ophthalmic solution Place 1 drop into both eyes 2 (two) times daily.    No facility-administered encounter medications on file as of 12/15/2016.     Allergies  Allergen Reactions  . Doxycycline   . Latex   . Omeprazole Swelling  .  Ranitidine     Developed bad heartburn and stomach pain  . Sulfa Antibiotics   . Pce  [Erythromycin] Rash    Review of Systems  Constitutional: Negative for fever and malaise/fatigue.  Respiratory: Negative for cough, shortness of breath and wheezing.   Cardiovascular: Negative for chest pain, palpitations, orthopnea, claudication and leg swelling.  Gastrointestinal: Negative.   Neurological: Negative for dizziness, weakness and headaches.  Endo/Heme/Allergies: Negative.   Psychiatric/Behavioral: Negative.     Objective:  BP (!) 164/86 (BP Location: Right Arm, Patient Position: Sitting, Cuff Size: Normal)   Pulse 64   Temp (!) 97.5 F (36.4 C) (Oral)   Resp 14   Wt 172 lb (78 kg)   BMI 23.33 kg/m   Physical Exam    Constitutional: He is oriented to person, place, and time and well-developed, well-nourished, and in no distress.  HENT:  Head: Normocephalic and atraumatic.  Right Ear: External ear normal.  Left Ear: External ear normal.  Nose: Nose normal.  Eyes: Conjunctivae are normal. No scleral icterus.  Neck: No thyromegaly present.  Cardiovascular: Normal rate, regular rhythm and normal heart sounds.  Pulmonary/Chest: Effort normal and breath sounds normal.  Abdominal: Soft.  Neurological: He is alert and oriented to person, place, and time. Gait normal. GCS score is 15.  Skin: Skin is warm and dry.  Psychiatric: Mood, memory, affect and judgment normal.    Assessment and Plan :  HTN HLD Prostate Cancer In remission.  I have done the exam and reviewed the chart and it is accurate to the best of my knowledge. Development worker, community has been used and  any errors in dictation or transcription are unintentional. Miguel Aschoff M.D. [facility]

## 2016-12-23 DIAGNOSIS — H401133 Primary open-angle glaucoma, bilateral, severe stage: Secondary | ICD-10-CM | POA: Diagnosis not present

## 2016-12-29 DIAGNOSIS — H401133 Primary open-angle glaucoma, bilateral, severe stage: Secondary | ICD-10-CM | POA: Diagnosis not present

## 2017-02-03 ENCOUNTER — Other Ambulatory Visit: Payer: Self-pay | Admitting: Family Medicine

## 2017-03-09 DIAGNOSIS — Z85828 Personal history of other malignant neoplasm of skin: Secondary | ICD-10-CM | POA: Diagnosis not present

## 2017-03-09 DIAGNOSIS — D2261 Melanocytic nevi of right upper limb, including shoulder: Secondary | ICD-10-CM | POA: Diagnosis not present

## 2017-03-09 DIAGNOSIS — L570 Actinic keratosis: Secondary | ICD-10-CM | POA: Diagnosis not present

## 2017-03-09 DIAGNOSIS — L821 Other seborrheic keratosis: Secondary | ICD-10-CM | POA: Diagnosis not present

## 2017-03-09 DIAGNOSIS — X32XXXA Exposure to sunlight, initial encounter: Secondary | ICD-10-CM | POA: Diagnosis not present

## 2017-03-09 DIAGNOSIS — D225 Melanocytic nevi of trunk: Secondary | ICD-10-CM | POA: Diagnosis not present

## 2017-04-15 DIAGNOSIS — H401133 Primary open-angle glaucoma, bilateral, severe stage: Secondary | ICD-10-CM | POA: Diagnosis not present

## 2017-05-18 DIAGNOSIS — H401133 Primary open-angle glaucoma, bilateral, severe stage: Secondary | ICD-10-CM | POA: Diagnosis not present

## 2017-05-31 DIAGNOSIS — H401133 Primary open-angle glaucoma, bilateral, severe stage: Secondary | ICD-10-CM | POA: Diagnosis not present

## 2017-05-31 DIAGNOSIS — H353131 Nonexudative age-related macular degeneration, bilateral, early dry stage: Secondary | ICD-10-CM | POA: Diagnosis not present

## 2017-06-15 ENCOUNTER — Encounter: Payer: Self-pay | Admitting: Family Medicine

## 2017-06-15 ENCOUNTER — Ambulatory Visit (INDEPENDENT_AMBULATORY_CARE_PROVIDER_SITE_OTHER): Payer: Medicare HMO | Admitting: Family Medicine

## 2017-06-15 VITALS — BP 128/76 | HR 68 | Temp 98.4°F | Resp 16 | Ht 72.0 in | Wt 165.0 lb

## 2017-06-15 DIAGNOSIS — I10 Essential (primary) hypertension: Secondary | ICD-10-CM | POA: Diagnosis not present

## 2017-06-15 DIAGNOSIS — J069 Acute upper respiratory infection, unspecified: Secondary | ICD-10-CM | POA: Diagnosis not present

## 2017-06-15 NOTE — Progress Notes (Signed)
Patient: Jeffrey Hendrix. Male    DOB: 1924/09/27   84 y.o.   MRN: [id number] Visit Date: 06/15/2017  Today's Provider: Wilhemena Durie, MD   Chief Complaint  Patient presents with  . Hypertension  . Hyperlipidemia  . URI   Subjective:    HPI  Hypertension, follow-up:  BP Readings from Last 3 Encounters:  06/15/17 128/76  12/15/16 (!) 164/86  07/28/16 (!) 160/82    He was last seen for hypertension 6 months ago.  BP at that visit was 164/86. Management since that visit includes no changes. He reports good compliance with treatment. He is not having side effects.  He is not exercising. He is adherent to low salt diet.   Outside blood pressures are checked daily. Usually runs in the 130s/70s at home.  He is experiencing none.  Patient denies chest pressure/discomfort, exertional chest pressure/discomfort and lower extremity edema.   Cardiovascular risk factors include dyslipidemia.   Weight trend: stable Wt Readings from Last 3 Encounters:  06/15/17 165 lb (74.8 kg)  12/15/16 172 lb (78 kg)  07/28/16 170 lb (77.1 kg)    Current diet: well balanced   URI Patient also mentions that he has had a cough, congestion, and PND X 1 week. He has used OTC allergy tablets with minor relief.     Allergies  Allergen Reactions  . Doxycycline   . Latex   . Omeprazole Swelling  . Ranitidine     Developed bad heartburn and stomach pain  . Sulfa Antibiotics   . Pce  [Erythromycin] Rash     Current Outpatient Medications:  .  hydrochlorothiazide (HYDRODIURIL) 12.5 MG tablet, TAKE 1/2 TABLET EVERY DAY, Disp: 45 tablet, Rfl: 12 .  levothyroxine (SYNTHROID, LEVOTHROID) 50 MCG tablet, TAKE 1 TABLET EVERY DAY, Disp: 90 tablet, Rfl: 3 .  simvastatin (ZOCOR) 10 MG tablet, TAKE 1 TABLET  DAILY AT 6 PM, Disp: 90 tablet, Rfl: 3 .  timolol (TIMOPTIC) 0.5 % ophthalmic solution, Place 1 drop into both eyes 2 (two) times daily. , Disp: , Rfl:   Review of Systems    Constitutional: Positive for fatigue. Negative for activity change, appetite change, chills, fever and unexpected weight change.  HENT: Positive for congestion and postnasal drip.   Respiratory: Positive for cough. Negative for apnea, choking, chest tightness, shortness of breath, wheezing and stridor.   Cardiovascular: Negative for chest pain, palpitations and leg swelling.  Endocrine: Negative.   Musculoskeletal: Negative for back pain, gait problem and joint swelling.  Allergic/Immunologic: Positive for environmental allergies.  Neurological: Negative for dizziness, weakness and headaches.  Psychiatric/Behavioral: Negative.     Social History   Tobacco Use  . Smoking status: Never Smoker  . Smokeless tobacco: Never Used  Substance Use Topics  . Alcohol use: Yes    Alcohol/week: 0.6 oz    Types: 1 Standard drinks or equivalent per week   Objective:   BP 128/76 (BP Location: Right Arm, Patient Position: Sitting, Cuff Size: Normal)   Pulse 68   Temp 98.4 F (36.9 C)   Resp 16   Ht 6' (1.829 m)   Wt 165 lb (74.8 kg)   SpO2 96%   BMI 22.38 kg/m  Vitals:   06/15/17 1126  BP: 128/76  Pulse: 68  Resp: 16  Temp: 98.4 F (36.9 C)  SpO2: 96%  Weight: 165 lb (74.8 kg)  Height: 6' (1.829 m)     Physical Exam  Constitutional: He is  oriented to person, place, and time. He appears well-developed and well-nourished.  HENT:  Head: Normocephalic and atraumatic.  Eyes: Conjunctivae are normal. No scleral icterus.  Neck: No thyromegaly present.  Cardiovascular: Normal rate, regular rhythm and normal heart sounds.  Pulmonary/Chest: Effort normal and breath sounds normal.  Abdominal: Soft.  Musculoskeletal: He exhibits no edema.  Neurological: He is alert and oriented to person, place, and time.  Skin: Skin is warm and dry.  Psychiatric: He has a normal mood and affect. His behavior is normal. Judgment and thought content normal.        Assessment & Plan:      URI/Cough Viral--generic Robitussin for cough. HTN  have done the exam and reviewed the chart and it is accurate to the best of my knowledge. Development worker, community has been used and  any errors in dictation or transcription are unintentional. Miguel Aschoff M.D. [place], MD  [facility]

## 2017-06-15 NOTE — Patient Instructions (Signed)
Generic Robitussin for cough

## 2017-06-19 DIAGNOSIS — J069 Acute upper respiratory infection, unspecified: Secondary | ICD-10-CM | POA: Insufficient documentation

## 2017-07-08 ENCOUNTER — Ambulatory Visit (INDEPENDENT_AMBULATORY_CARE_PROVIDER_SITE_OTHER): Payer: Medicare HMO | Admitting: Family Medicine

## 2017-07-08 ENCOUNTER — Encounter: Payer: Self-pay | Admitting: Family Medicine

## 2017-07-08 VITALS — BP 138/82 | HR 66 | Temp 97.5°F | Resp 16 | Ht 72.0 in | Wt 167.0 lb

## 2017-07-08 DIAGNOSIS — E785 Hyperlipidemia, unspecified: Secondary | ICD-10-CM | POA: Diagnosis not present

## 2017-07-08 DIAGNOSIS — R109 Unspecified abdominal pain: Secondary | ICD-10-CM

## 2017-07-08 DIAGNOSIS — I10 Essential (primary) hypertension: Secondary | ICD-10-CM

## 2017-07-08 LAB — POCT URINALYSIS DIPSTICK
BILIRUBIN UA: NEGATIVE
GLUCOSE UA: NEGATIVE
Ketones, UA: NEGATIVE
Leukocytes, UA: NEGATIVE
NITRITE UA: NEGATIVE
Protein, UA: NEGATIVE
RBC UA: NEGATIVE
Spec Grav, UA: 1.02 (ref 1.010–1.025)
UROBILINOGEN UA: 0.2 U/dL
pH, UA: 7.5 (ref 5.0–8.0)

## 2017-07-08 NOTE — Patient Instructions (Signed)
Heating pad on low heat and use Tylenol for pain

## 2017-07-08 NOTE — Progress Notes (Signed)
Patient: Jeffrey Hendrix. Male    DOB: 07-02-1924   84 y.o.   MRN: [id number] Visit Date: 07/08/2017  Today's Provider: Wilhemena Durie, MD   Chief Complaint  Patient presents with  . Flank Pain   Subjective:    Flank Pain  This is a new problem. The current episode started 1 to 4 weeks ago (about 3 weeks). The problem occurs constantly. The problem is unchanged. The pain is at a severity of 3/10. The pain is mild. The symptoms are aggravated by bending and twisting. Stiffness is present all day. Associated symptoms include abdominal pain.       Allergies  Allergen Reactions  . Doxycycline   . Latex   . Omeprazole Swelling  . Ranitidine     Developed bad heartburn and stomach pain  . Sulfa Antibiotics   . Pce  [Erythromycin] Rash     Current Outpatient Medications:  .  hydrochlorothiazide (HYDRODIURIL) 12.5 MG tablet, TAKE 1/2 TABLET EVERY DAY, Disp: 45 tablet, Rfl: 12 .  levothyroxine (SYNTHROID, LEVOTHROID) 50 MCG tablet, TAKE 1 TABLET EVERY DAY, Disp: 90 tablet, Rfl: 3 .  simvastatin (ZOCOR) 10 MG tablet, TAKE 1 TABLET  DAILY AT 6 PM, Disp: 90 tablet, Rfl: 3 .  timolol (TIMOPTIC) 0.5 % ophthalmic solution, Place 1 drop into both eyes 2 (two) times daily. , Disp: , Rfl:   Review of Systems  Constitutional: Negative for activity change, appetite change and fatigue.  HENT: Negative.   Eyes: Negative.   Respiratory: Negative.   Cardiovascular: Negative.   Gastrointestinal: Positive for abdominal pain. Negative for abdominal distention, anal bleeding, blood in stool, constipation, diarrhea, nausea and vomiting.  Endocrine: Negative.   Genitourinary: Positive for flank pain. Negative for discharge, hematuria, penile pain, penile swelling, scrotal swelling and testicular pain.  Musculoskeletal: Positive for myalgias. Negative for arthralgias.  Allergic/Immunologic: Negative.   Psychiatric/Behavioral: Negative.     Social History   Tobacco Use  .  Smoking status: Never Smoker  . Smokeless tobacco: Never Used  Substance Use Topics  . Alcohol use: Yes    Alcohol/week: 0.6 oz    Types: 1 Standard drinks or equivalent per week   Objective:   BP 138/82 (BP Location: Right Arm, Patient Position: Sitting, Cuff Size: Normal)   Pulse 66   Temp (!) 97.5 F (36.4 C)   Resp 16   Ht 6' (1.829 m)   Wt 167 lb (75.8 kg)   SpO2 98%   BMI 22.65 kg/m  Vitals:   07/08/17 1011  BP: 138/82  Pulse: 66  Resp: 16  Temp: (!) 97.5 F (36.4 C)  SpO2: 98%  Weight: 167 lb (75.8 kg)  Height: 6' (1.829 m)     Physical Exam  Constitutional: He is oriented to person, place, and time. He appears well-developed and well-nourished.  HENT:  Head: Normocephalic and atraumatic.  Right Ear: External ear normal.  Left Ear: External ear normal.  Nose: Nose normal.  Eyes: Conjunctivae are normal. No scleral icterus.  Neck: No thyromegaly present.  Cardiovascular: Normal rate, regular rhythm and normal heart sounds.  Pulmonary/Chest: Effort normal and breath sounds normal.  Abdominal: Soft.  Musculoskeletal: He exhibits no edema.  Neurological: He is alert and oriented to person, place, and time.  Skin: Skin is warm and dry.  Psychiatric: He has a normal mood and affect. His behavior is normal. Judgment and thought content normal.        Assessment &  Plan:     1. Flank pain This appears to be muscular as it is reproducible with certain movements.Return prn. - POCT urinalysis dipstick-negative. 2.HTN 3.HLD     I have done the exam and reviewed the above chart and it is accurate to the best of my knowledge. Development worker, community has been used in this note in any air is in the dictation or transcription are unintentional.  Wilhemena Durie, MD  [place]

## 2017-07-27 ENCOUNTER — Telehealth: Payer: Self-pay | Admitting: Family Medicine

## 2017-07-27 MED ORDER — LEVOTHYROXINE SODIUM 50 MCG PO TABS: 50.0000 ug | ORAL_TABLET | Freq: Every day | ORAL | 3 refills | Status: DC

## 2017-07-27 NOTE — Telephone Encounter (Signed)
pt needs a refill on his Levothyroxine 50 MCG  90 days  [facility]  Pt's CB  [phone]  Thanks Con Memos

## 2017-08-04 ENCOUNTER — Other Ambulatory Visit: Payer: Self-pay | Admitting: Family Medicine

## 2017-08-04 MED ORDER — LEVOTHYROXINE SODIUM 50 MCG PO TABS: 50.0000 ug | ORAL_TABLET | Freq: Every day | ORAL | 3 refills | Status: DC

## 2017-08-04 NOTE — Telephone Encounter (Signed)
Pt's wife contacted office for refill request on the following medications:  levothyroxine (SYNTHROID, LEVOTHROID) 50 MCG tablet  [facility]  The Rx was sent to [facility] and it was requested that it be sent to [facility]. Pt's wife is requesting it be sent to [facility] today if possible. Please advise. Thanks TNP

## 2017-08-11 ENCOUNTER — Other Ambulatory Visit: Payer: Self-pay | Admitting: Family Medicine

## 2017-08-11 MED ORDER — LEVOTHYROXINE SODIUM 50 MCG PO TABS: 50.0000 ug | ORAL_TABLET | Freq: Every day | ORAL | 3 refills | Status: DC

## 2017-08-11 NOTE — Telephone Encounter (Signed)
Pt's wife Cyndra Numbers stated they requested the Rx for levothyroxine (SYNTHROID, LEVOTHROID) 50 MCG tablet be sent to [place] at pt's [place] on 07/08/17 and again over the phone on 08/04/17. The Rx was sent to local pharmacy. Pt is no completely out of the medication and requesting an Rx for 10 day supply be sent to [place] and 90 day supply with refills be sent to [facility]. Cyndra Numbers is requesting this be done today since pt is out of the medication. Please advise. Thanks TNP

## 2017-09-02 DIAGNOSIS — H401133 Primary open-angle glaucoma, bilateral, severe stage: Secondary | ICD-10-CM | POA: Diagnosis not present

## 2017-09-07 DIAGNOSIS — H401133 Primary open-angle glaucoma, bilateral, severe stage: Secondary | ICD-10-CM | POA: Diagnosis not present

## 2017-10-19 DIAGNOSIS — H401133 Primary open-angle glaucoma, bilateral, severe stage: Secondary | ICD-10-CM | POA: Diagnosis not present

## 2017-10-28 ENCOUNTER — Other Ambulatory Visit: Payer: Self-pay | Admitting: Family Medicine

## 2017-11-11 ENCOUNTER — Emergency Department
Admission: EM | Admit: 2017-11-11 | Discharge: 2017-11-11 | Disposition: A | Discharge status: Home / Self Care | Payer: Medicare HMO | Attending: Emergency Medicine | Admitting: Emergency Medicine

## 2017-11-11 ENCOUNTER — Other Ambulatory Visit: Payer: Self-pay

## 2017-11-11 ENCOUNTER — Encounter: Payer: Self-pay | Admitting: Emergency Medicine

## 2017-11-11 ENCOUNTER — Telehealth: Payer: Self-pay

## 2017-11-11 DIAGNOSIS — I10 Essential (primary) hypertension: Secondary | ICD-10-CM | POA: Insufficient documentation

## 2017-11-11 DIAGNOSIS — T7840XA Allergy, unspecified, initial encounter: Secondary | ICD-10-CM | POA: Diagnosis not present

## 2017-11-11 DIAGNOSIS — K140 Glossitis: Secondary | ICD-10-CM | POA: Insufficient documentation

## 2017-11-11 DIAGNOSIS — E039 Hypothyroidism, unspecified: Secondary | ICD-10-CM | POA: Insufficient documentation

## 2017-11-11 DIAGNOSIS — K0855 Allergy to existing dental restorative material: Secondary | ICD-10-CM | POA: Diagnosis not present

## 2017-11-11 DIAGNOSIS — Z79899 Other long term (current) drug therapy: Secondary | ICD-10-CM | POA: Insufficient documentation

## 2017-11-11 MED ORDER — DEXAMETHASONE SODIUM PHOSPHATE 10 MG/ML IJ SOLN
10.0000 mg | Freq: Once | INTRAMUSCULAR | Status: AC
  Administered 2017-11-11: 10 mg via INTRAVENOUS
  Filled 2017-11-11: qty 1

## 2017-11-11 MED ORDER — DIPHENHYDRAMINE HCL 50 MG/ML IJ SOLN
25.0000 mg | Freq: Once | INTRAMUSCULAR | Status: AC
  Administered 2017-11-11: 25 mg via INTRAVENOUS
  Filled 2017-11-11: qty 1

## 2017-11-11 NOTE — ED Notes (Signed)
E signature pad not working 

## 2017-11-11 NOTE — ED Notes (Signed)
Pt arrived via POV with reports of tongue swelling, pt states he has hx of the same, states he is not on an ACE inhibitor, pt states he had some bridge work done yesterday at the dentist and states he may be allergic to the cement used.  Pt denies any SOB. Pt reports there is some swelling in gum line as well on the right side.

## 2017-11-11 NOTE — Discharge Instructions (Addendum)
You have been seen in the Emergency Department (ED) today for an allergic reaction.  You have been stable throughout your stay in the Emergency Department.  Please take your medications as prescribed and follow up with your doctor as indicated.  You should also take over-the-counter Benadryl around the clock for the next three days according to the dosing instructions on the package. ° °Return to the Emergency Department (ED) if you experience any worsening or new symptoms that concern you. ° ° °

## 2017-11-11 NOTE — ED Triage Notes (Signed)
Pt reports that he had a dental procedure yesterday and woke up with his tongue swollen. He had this happen in the past after dental procedure. The left side of his tongue is swollen. Speech is slurred dt tongue.

## 2017-11-11 NOTE — ED Provider Notes (Signed)
[facility] Emergency Department Provider Note   ____________________________________________   First MD Initiated Contact with Patient 11/11/17 1050     (approximate)  I have reviewed the triage vital signs and the nursing notes.   HISTORY  Chief Complaint Allergic Reaction    HPI Jeffrey Hendrix. is a 84 y.o. male here for evaluation of tongue swelling  Patient reports he had a new dental bridge placed yesterday, he noticed that the left side of his tongue in the area where the bridge was placed was swollen.  Reports 2 additional times in a recent dental work that he has had swelling afterwards as well, once over the front lower lip and once about a year and a half ago where he had exact same symptoms and reports he was treated with steroid and took Benadryl and got better  No nausea vomiting.  No fevers or chills.  No chest pain or trouble breathing.  Reports able to open and close the mouth and the jaw itself does not hurt at all its just swollen mostly along the left side of his tongue.  No trouble breathing or swallowing   Past Medical History:  Diagnosis Date  . Cancer ([place])    PROSTATE  . Glaucoma   . Hyperlipidemia   . Hypertension     Patient Active Problem List   Diagnosis Date Noted  . URI (upper respiratory infection) 06/19/2017  . Benign fibroma of prostate 11/28/2014  . Colon polyp 11/28/2014  . Essential (primary) hypertension 11/28/2014  . Bergmann's syndrome 11/28/2014  . HLD (hyperlipidemia) 11/28/2014  . Adult hypothyroidism 11/28/2014  . Esophagitis, reflux 11/28/2014  . Has a tremor 11/28/2014  . Glaucoma 04/19/2014  . Personal history of colonic polyps 09/28/2013  . History of colon polyps 09/28/2013    Past Surgical History:  Procedure Laterality Date  . CATARACT EXTRACTION  2012, 2013  . COLON RESECTION  2009  . COLONOSCOPY  09-14-08,09/2013.   Dr Jamal Collin  . COLONOSCOPY WITH PROPOFOL N/A 10/31/2014   Procedure: COLONOSCOPY WITH PROPOFOL;  Surgeon: Christene Lye, MD;  Location: [facility] ENDOSCOPY;  Service: Endoscopy;  Laterality: N/A;  . [place]    Prior to Admission medications   Medication Sig Start Date End Date Taking? Authorizing Provider  hydrochlorothiazide (HYDRODIURIL) 12.5 MG tablet TAKE 1/2 TABLET EVERY DAY Patient taking differently: Take 6.25 mg by mouth daily.  10/28/17  Yes Jerrol Banana., MD  latanoprost (XALATAN) 0.005 % ophthalmic solution Place 1 drop into both eyes at bedtime. 09/17/17  Yes [provider]  levothyroxine (SYNTHROID, LEVOTHROID) 50 MCG tablet Take 1 tablet (50 mcg total) by mouth daily. 08/11/17  Yes Jerrol Banana., MD  simvastatin (ZOCOR) 10 MG tablet TAKE 1 TABLET  DAILY AT 6 PM Patient taking differently: Take 10 mg by mouth every evening.  02/04/17  Yes Jerrol Banana., MD  timolol (TIMOPTIC) 0.5 % ophthalmic solution Place 1 drop into both eyes 2 (two) times daily.    Yes [provider]    Allergies Doxycycline; Latex; Omeprazole; Ranitidine; Sulfa antibiotics; and Pce  [erythromycin]  Family History  Problem Relation Age of Onset  . Stroke Mother   . Colon cancer Father   . Colon cancer Sister   . Lung cancer Sister   . Lung cancer Brother   . Diabetes Sister   . Heart disease Sister   . Heart disease Sister     Social History Social  History   Tobacco Use  . Smoking status: Never Smoker  . Smokeless tobacco: Never Used  Substance Use Topics  . Alcohol use: Yes    Alcohol/week: 1.0 standard drinks    Types: 1 Standard drinks or equivalent per week  . Drug use: No    Review of Systems Constitutional: No fever/chills Eyes: No visual changes. ENT: No sore throat.  See HPI.  Denies that the teeth hurt.  Denies any swelling along the jawline Cardiovascular: Denies chest pain. Respiratory: Denies shortness of breath. Gastrointestinal: No abdominal pain.   Genitourinary:  Negative for dysuria. Musculoskeletal: Negative for back pain. Skin: Negative for rash. Neurological: Negative for headaches, areas of focal weakness or numbness. Does not take any ACE inhibitors, like lisinopril   ____________________________________________   PHYSICAL EXAM:  VITAL SIGNS: ED Triage Vitals  Enc Vitals Group     BP 11/11/17 1009 (!) 192/86     Pulse Rate 11/11/17 1009 97     Resp 11/11/17 1009 16     Temp 11/11/17 1009 (!) 97.5 F (36.4 C)     Temp Source 11/11/17 1009 Oral     SpO2 11/11/17 1009 98 %     Weight 11/11/17 1009 170 lb (77.1 kg)     Height 11/11/17 1009 6' (1.829 m)     Head Circumference --      Peak Flow --      Pain Score 11/11/17 1014 0     Pain Loc --      Pain Edu? --      Excl. in [place]? --     Constitutional: Alert and oriented. Well appearing and in no acute distress. Eyes: Conjunctivae are normal. Head: Atraumatic. Nose: No congestion/rhinnorhea. Mouth/Throat: Mucous membranes are moist.  Oral pharyngeal exam demonstrates some mild edema overlying the left side of the lingula only.  The floor the mouth is nontender without swelling.  The posterior oropharynx is widely patent.  No stridor.  No trouble swallowing or handling secretions.  No erythema of the neck or jaw. Neck: No stridor.  Cardiovascular: Normal rate, regular rhythm. Grossly normal heart sounds.  Good peripheral circulation. Respiratory: Normal respiratory effort.  No retractions. Lungs CTAB. Gastrointestinal: Soft and nontender. No distention. Musculoskeletal: No lower extremity tenderness nor edema. Neurologic:  Normal speech and language. No gross focal neurologic deficits are appreciated.  Skin:  Skin is warm, dry and intact. No rash noted. Psychiatric: Mood and affect are normal. Speech and behavior are normal.  ____________________________________________   LABS (all labs ordered are listed, but only abnormal results are displayed)  Labs Reviewed - No data to  display ____________________________________________  EKG   ____________________________________________  RADIOLOGY   ____________________________________________   PROCEDURES  Procedure(s) performed: None  Procedures  Critical Care performed: No  ____________________________________________   INITIAL IMPRESSION / ASSESSMENT AND PLAN / ED COURSE  Pertinent labs & imaging results that were available during my care of the patient were reviewed by me and considered in my medical decision making (see chart for details).   I did review the patient's clinical history, I suspect he is likely having some type of allergic reaction to dental product perhaps the cement being used to place his dental work.  Is the third such time that he had swelling more than a day or 2 of having dental work.  There is no signs or evidence of infection, no evidence of impending airway compromise.  Will treat with Decadron and Benadryl similar to how he was treated  with previous presentation and observe for improvement.  Reviewed his medication list I do not see any medications that would be suspect for causing this reaction.  No evidence of anaphylaxis or systemic reaction  ----------------------------------------- 2:27 PM on 11/11/2017 -----------------------------------------  Patient requesting to be discharged, reports his symptoms are improving and his tongue swelling is decreased.  On exam is swelling on his tongue has improved, there is just a very slight amount of edema overlying the left side of the tongue only.  I think it is appropriate that his symptoms are improving that he could be discharged.  Careful return precautions discussed with patient and he will pick up over-the-counter Benadryl for use during the next 48 hours as well.  Return precautions and treatment recommendations and follow-up discussed with the patient who is agreeable with the plan.  Patient agrees he will let his dentist  know about reaction.      ____________________________________________   FINAL CLINICAL IMPRESSION(S) / ED DIAGNOSES  Final diagnoses:  Allergic reaction, initial encounter        Note:  This document was prepared using Dragon voice recognition software and may include unintentional dictation errors       Delman Kitten, MD 11/11/17 1428

## 2017-11-11 NOTE — Telephone Encounter (Signed)
Call from the patient wife to report the patient woke up this morning with swollen tongue.  She reports it is getting progressively worse and now his gums are swollen.  She reports he had a dental procedure done yesterday and when he had similar procedure in the past he had the same reaction.  Instructed wife to have patient seen in the ER.

## 2017-12-01 ENCOUNTER — Ambulatory Visit: Payer: Medicare HMO

## 2017-12-20 DIAGNOSIS — H353131 Nonexudative age-related macular degeneration, bilateral, early dry stage: Secondary | ICD-10-CM | POA: Diagnosis not present

## 2017-12-22 ENCOUNTER — Ambulatory Visit (INDEPENDENT_AMBULATORY_CARE_PROVIDER_SITE_OTHER): Payer: Medicare HMO | Admitting: Family Medicine

## 2017-12-22 ENCOUNTER — Ambulatory Visit (INDEPENDENT_AMBULATORY_CARE_PROVIDER_SITE_OTHER): Payer: Medicare HMO

## 2017-12-22 VITALS — BP 156/78 | HR 69 | Temp 97.9°F | Ht 72.0 in | Wt 168.6 lb

## 2017-12-22 VITALS — BP 156/78 | HR 69 | Temp 97.9°F | Resp 16 | Ht 72.0 in | Wt 168.0 lb

## 2017-12-22 DIAGNOSIS — Z0000 Encounter for general adult medical examination without abnormal findings: Secondary | ICD-10-CM | POA: Diagnosis not present

## 2017-12-22 DIAGNOSIS — E039 Hypothyroidism, unspecified: Secondary | ICD-10-CM | POA: Diagnosis not present

## 2017-12-22 DIAGNOSIS — E785 Hyperlipidemia, unspecified: Secondary | ICD-10-CM | POA: Diagnosis not present

## 2017-12-22 DIAGNOSIS — G250 Essential tremor: Secondary | ICD-10-CM

## 2017-12-22 DIAGNOSIS — I10 Essential (primary) hypertension: Secondary | ICD-10-CM | POA: Diagnosis not present

## 2017-12-22 NOTE — Patient Instructions (Addendum)
Mr. Jeffrey Hendrix , Thank you for taking time to come for your Medicare Wellness Visit. I appreciate your ongoing commitment to your health goals. Please review the following plan we discussed and let me know if I can assist you in the future.   Screening recommendations/referrals: Colonoscopy: No longer required.  Recommended yearly ophthalmology/optometry visit for glaucoma screening and checkup Recommended yearly dental visit for hygiene and checkup  Vaccinations: Influenza vaccine: Up to date Pneumococcal vaccine: Pneumovax 23 up to date, declined Prevnar 78 today.  Tdap vaccine: Up to date, due 07/2022 Shingles vaccine: Pt declines today.     Advanced directives: Advance directive discussed with you today. Even though you declined this today please call our office should you change your mind and we can give you the proper paperwork for you to fill out.  Conditions/risks identified: Recommend to drink at least 6-8 8oz glasses of water per day.  Next appointment: 9:40 AM today with Dr Rosanna Randy.   Preventive Care 45 Years and Older, Male Preventive care refers to lifestyle choices and visits with your health care provider that can promote health and wellness. What does preventive care include?  A yearly physical exam. This is also called an annual well check.  Dental exams once or twice a year.  Routine eye exams. Ask your health care provider how often you should have your eyes checked.  Personal lifestyle choices, including:  Daily care of your teeth and gums.  Regular physical activity.  Eating a healthy diet.  Avoiding tobacco and drug use.  Limiting alcohol use.  Practicing safe sex.  Taking low doses of aspirin every day.  Taking vitamin and mineral supplements as recommended by your health care provider. What happens during an annual well check? The services and screenings done by your health care provider during your annual well check will depend on your age, overall  health, lifestyle risk factors, and family history of disease. Counseling  Your health care provider may ask you questions about your:  Alcohol use.  Tobacco use.  Drug use.  Emotional well-being.  Home and relationship well-being.  Sexual activity.  Eating habits.  History of falls.  Memory and ability to understand (cognition).  Work and work Statistician. Screening  You may have the following tests or measurements:  Height, weight, and BMI.  Blood pressure.  Lipid and cholesterol levels. These may be checked every 5 years, or more frequently if you are over 89 years old.  Skin check.  Lung cancer screening. You may have this screening every year starting at age 92 if you have a 30-pack-year history of smoking and currently smoke or have quit within the past 15 years.  Fecal occult blood test (FOBT) of the stool. You may have this test every year starting at age 22.  Flexible sigmoidoscopy or colonoscopy. You may have a sigmoidoscopy every 5 years or a colonoscopy every 10 years starting at age 53.  Prostate cancer screening. Recommendations will vary depending on your family history and other risks.  Hepatitis C blood test.  Hepatitis B blood test.  Sexually transmitted disease (STD) testing.  Diabetes screening. This is done by checking your blood sugar (glucose) after you have not eaten for a while (fasting). You may have this done every 1-3 years.  Abdominal aortic aneurysm (AAA) screening. You may need this if you are a current or former smoker.  Osteoporosis. You may be screened starting at age 46 if you are at high risk. Talk with your health care  provider about your test results, treatment options, and if necessary, the need for more tests. Vaccines  Your health care provider may recommend certain vaccines, such as:  Influenza vaccine. This is recommended every year.  Tetanus, diphtheria, and acellular pertussis (Tdap, Td) vaccine. You may need a Td  booster every 10 years.  Zoster vaccine. You may need this after age 78.  Pneumococcal 13-valent conjugate (PCV13) vaccine. One dose is recommended after age 47.  Pneumococcal polysaccharide (PPSV23) vaccine. One dose is recommended after age 20. Talk to your health care provider about which screenings and vaccines you need and how often you need them. This information is not intended to replace advice given to you by your health care provider. Make sure you discuss any questions you have with your health care provider. Document Released: 02/01/2015 Document Revised: 09/25/2015 Document Reviewed: 11/06/2014 Elsevier Interactive Patient Education  2017 [place] Prevention in the Home Falls can cause injuries. They can happen to people of all ages. There are many things you can do to make your home safe and to help prevent falls. What can I do on the outside of my home?  Regularly fix the edges of walkways and driveways and fix any cracks.  Remove anything that might make you trip as you walk through a door, such as a raised step or threshold.  Trim any bushes or trees on the path to your home.  Use bright outdoor lighting.  Clear any walking paths of anything that might make someone trip, such as rocks or tools.  Regularly check to see if handrails are loose or broken. Make sure that both sides of any steps have handrails.  Any raised decks and porches should have guardrails on the edges.  Have any leaves, snow, or ice cleared regularly.  Use sand or salt on walking paths during winter.  Clean up any spills in your garage right away. This includes oil or grease spills. What can I do in the bathroom?  Use night lights.  Install grab bars by the toilet and in the tub and shower. Do not use towel bars as grab bars.  Use non-skid mats or decals in the tub or shower.  If you need to sit down in the shower, use a plastic, non-slip stool.  Keep the floor dry. Clean up  any water that spills on the floor as soon as it happens.  Remove soap buildup in the tub or shower regularly.  Attach bath mats securely with double-sided non-slip rug tape.  Do not have throw rugs and other things on the floor that can make you trip. What can I do in the bedroom?  Use night lights.  Make sure that you have a light by your bed that is easy to reach.  Do not use any sheets or blankets that are too big for your bed. They should not hang down onto the floor.  Have a firm chair that has side arms. You can use this for support while you get dressed.  Do not have throw rugs and other things on the floor that can make you trip. What can I do in the kitchen?  Clean up any spills right away.  Avoid walking on wet floors.  Keep items that you use a lot in easy-to-reach places.  If you need to reach something above you, use a strong step stool that has a grab bar.  Keep electrical cords out of the way.  Do not use floor polish  or wax that makes floors slippery. If you must use wax, use non-skid floor wax.  Do not have throw rugs and other things on the floor that can make you trip. What can I do with my stairs?  Do not leave any items on the stairs.  Make sure that there are handrails on both sides of the stairs and use them. Fix handrails that are broken or loose. Make sure that handrails are as long as the stairways.  Check any carpeting to make sure that it is firmly attached to the stairs. Fix any carpet that is loose or worn.  Avoid having throw rugs at the top or bottom of the stairs. If you do have throw rugs, attach them to the floor with carpet tape.  Make sure that you have a light switch at the top of the stairs and the bottom of the stairs. If you do not have them, ask someone to add them for you. What else can I do to help prevent falls?  Wear shoes that:  Do not have high heels.  Have rubber bottoms.  Are comfortable and fit you well.  Are  closed at the toe. Do not wear sandals.  If you use a stepladder:  Make sure that it is fully opened. Do not climb a closed stepladder.  Make sure that both sides of the stepladder are locked into place.  Ask someone to hold it for you, if possible.  Clearly mark and make sure that you can see:  Any grab bars or handrails.  First and last steps.  Where the edge of each step is.  Use tools that help you move around (mobility aids) if they are needed. These include:  Canes.  Walkers.  Scooters.  Crutches.  Turn on the lights when you go into a dark area. Replace any light bulbs as soon as they burn out.  Set up your furniture so you have a clear path. Avoid moving your furniture around.  If any of your floors are uneven, fix them.  If there are any pets around you, be aware of where they are.  Review your medicines with your doctor. Some medicines can make you feel dizzy. This can increase your chance of falling. Ask your doctor what other things that you can do to help prevent falls. This information is not intended to replace advice given to you by your health care provider. Make sure you discuss any questions you have with your health care provider. Document Released: 11/01/2008 Document Revised: 06/13/2015 Document Reviewed: 02/09/2014 Elsevier Interactive Patient Education  2017 [employer].

## 2017-12-22 NOTE — Progress Notes (Signed)
Subjective:   Jeffrey Beltre. is a 84 y.o. male who presents for Medicare Annual/Subsequent preventive examination.  Review of Systems:  N/A  Cardiac Risk Factors include: advanced age (>56men, >64 women);dyslipidemia;hypertension;male gender     Objective:    Vitals: BP (!) 156/78 (BP Location: Right Arm)   Pulse 69   Temp 97.9 F (36.6 C) (Oral)   Ht 6' (1.829 m)   Wt 168 lb 9.6 oz (76.5 kg)   BMI 22.87 kg/m   Body mass index is 22.87 kg/m.  Advanced Directives 12/22/2017 11/11/2017 02/13/2016 12/31/2015 12/03/2014 11/12/2014  Does Patient Have a Medical Advance Directive? No No No Yes;No No No  Would patient like information on creating a medical advance directive? No - Patient declined No - Patient declined - - - -    Tobacco Social History   Tobacco Use  Smoking Status Never Smoker  Smokeless Tobacco Never Used     Counseling given: Not Answered   Clinical Intake:  Pre-visit preparation completed: Yes  Pain : No/denies pain Pain Score: 0-No pain     Nutritional Status: BMI of 19-24  Normal Nutritional Risks: None Diabetes: No  How often do you need to have someone help you when you read instructions, pamphlets, or other written materials from your doctor or pharmacy?: 1 - Never  Interpreter Needed?: No  Information entered by :: [facility], LPN  Past Medical History:  Diagnosis Date  . Cancer ([place])    PROSTATE  . Glaucoma   . Hyperlipidemia   . Hypertension    Past Surgical History:  Procedure Laterality Date  . CATARACT EXTRACTION  2012, 2013  . COLON RESECTION  2009  . COLONOSCOPY  09-14-08,09/2013.   Dr Jamal Collin  . COLONOSCOPY WITH PROPOFOL N/A 10/31/2014   Procedure: COLONOSCOPY WITH PROPOFOL;  Surgeon: Christene Lye, MD;  Location: [facility] ENDOSCOPY;  Service: Endoscopy;  Laterality: N/A;  . PROSTATE SURGERY  1996   Family History  Problem Relation Age of Onset  . Stroke Mother   . Colon cancer Father   . Colon cancer  Sister   . Lung cancer Sister   . Lung cancer Brother   . Diabetes Sister   . Heart disease Sister   . Heart disease Sister    Social History   Socioeconomic History  . Marital status: Married    Spouse name: Not on file  . Number of children: 2  . Years of education: Not on file  . Highest education level: Bachelor's degree (e.g., BA, AB, BS)  Occupational History  . Occupation: retired  Scientific laboratory technician  . Financial resource strain: Not hard at all  . Food insecurity:    Worry: Never true    Inability: Never true  . Transportation needs:    Medical: No    Non-medical: No  Tobacco Use  . Smoking status: Never Smoker  . Smokeless tobacco: Never Used  Substance and Sexual Activity  . Alcohol use: Not Currently  . Drug use: No  . Sexual activity: Not on file  Lifestyle  . Physical activity:    Days per week: 0 days    Minutes per session: 0 min  . Stress: Not at all  Relationships  . Social connections:    Talks on phone: Patient refused    Gets together: Patient refused    Attends religious service: Patient refused    Active member of club or organization: Patient refused    Attends meetings of clubs or organizations:  Patient refused    Relationship status: Patient refused  Other Topics Concern  . Not on file  Social History Narrative  . Not on file    Outpatient Encounter Medications as of 12/22/2017  Medication Sig  . hydrochlorothiazide (HYDRODIURIL) 12.5 MG tablet TAKE 1/2 TABLET EVERY DAY (Patient taking differently: Take 6.25 mg by mouth daily. )  . latanoprost (XALATAN) 0.005 % ophthalmic solution Place 1 drop into both eyes at bedtime.  Marland Kitchen levothyroxine (SYNTHROID, LEVOTHROID) 50 MCG tablet Take 1 tablet (50 mcg total) by mouth daily.  . simvastatin (ZOCOR) 10 MG tablet TAKE 1 TABLET  DAILY AT 6 PM (Patient taking differently: Take 10 mg by mouth every evening. )  . timolol (TIMOPTIC) 0.5 % ophthalmic solution Place 1 drop into both eyes 2 (two) times daily.     No facility-administered encounter medications on file as of 12/22/2017.     Activities of Daily Living In your present state of health, do you have any difficulty performing the following activities: 12/22/2017 07/08/2017  Hearing? N N  Vision? Y N  Comment Due to glaucoma. Wears eye glasses as needed.  -  Difficulty concentrating or making decisions? N N  Walking or climbing stairs? N Y  Dressing or bathing? N N  Doing errands, shopping? N N  Preparing Food and eating ? N -  Using the Toilet? N -  In the past six months, have you accidently leaked urine? N -  Do you have problems with loss of bowel control? N -  Managing your Medications? N -  Managing your Finances? N -  Housekeeping or managing your Housekeeping? N -  Some recent data might be hidden    Patient Care Team: Jerrol Banana., MD as PCP - General (Family Medicine) Leandrew Koyanagi, MD as Referring Physician (Ophthalmology) Dasher, Rayvon Char, MD (Dermatology)   Assessment:   This is a routine wellness examination for [name].  Exercise Activities and Dietary recommendations Current Exercise Habits: The patient does not participate in regular exercise at present, Exercise limited by: None identified  Goals    . DIET - INCREASE WATER INTAKE     Recommend to drink at least 6-8 8oz glasses of water per day.       Fall Risk Fall Risk  12/22/2017 07/08/2017 12/31/2015 12/31/2014  Falls in the past year? 0 No No No   FALL RISK PREVENTION PERTAINING TO THE HOME:  Any stairs in or around the home WITH handrails? No  Home free of loose throw rugs in walkways, pet beds, electrical cords, etc? Yes  Adequate lighting in your home to reduce risk of falls? Yes   ASSISTIVE DEVICES UTILIZED TO PREVENT FALLS:  Life alert? No  Use of a cane, walker or w/c? No  Grab bars in the bathroom? No  Shower chair or bench in shower? No  Elevated toilet seat or a handicapped toilet? No    TIMED UP AND GO:  Was the  test performed? No .    Depression Screen PHQ 2/9 Scores 12/22/2017 07/08/2017 12/31/2015 12/31/2014  PHQ - 2 Score 0 0 0 0    Cognitive Function: Declined today.         Immunization History  Administered Date(s) Administered  . Influenza, High Dose Seasonal PF 11/26/2015, 10/28/2016  . Influenza-Unspecified 11/23/2017  . Pneumococcal Polysaccharide-23 01/25/1992  . Td 01/22/2010  . Tdap 08/02/2012  . Zoster 01/16/2013    Qualifies for Shingles Vaccine? Yes  Zostavax completed 01/16/13. Due for  Shingrix. Education has been provided regarding the importance of this vaccine. Pt has been advised to call insurance company to determine out of pocket expense. Advised may also receive vaccine at local pharmacy or Health Dept. Verbalized acceptance and understanding.  Tdap: Up to date  Flu Vaccine: Up to date  Pneumococcal Vaccine: Due for Pneumococcal vaccine. Does the patient want to receive this vaccine today?  No . Education has been provided regarding the importance of this vaccine but still declined. Advised may receive this vaccine at local pharmacy or Health Dept. Aware to provide a copy of the vaccination record if obtained from local pharmacy or Health Dept. Verbalized acceptance and understanding.   Screening Tests Health Maintenance  Topic Date Due  . PNA vac Low Risk Adult (2 of 2 - PCV13) 01/24/1993  . TETANUS/TDAP  08/03/2022  . INFLUENZA VACCINE  Completed   Cancer Screenings:  Colorectal Screening: No longer required.   Lung Cancer Screening: (Low Dose CT Chest recommended if Age 8-80 years, 30 pack-year currently smoking OR have quit w/in 15years.) does not qualify.    Additional Screening:  Vision Screening: Recommended annual ophthalmology exams for early detection of glaucoma and other disorders of the eye.  Dental Screening: Recommended annual dental exams for proper oral hygiene  Community Resource Referral:  CRR required this visit?  No          Plan:  I have personally reviewed and addressed the Medicare Annual Wellness questionnaire and have noted the following in the patient's chart:  A. Medical and social history B. Use of alcohol, tobacco or illicit drugs  C. Current medications and supplements D. Functional ability and status E.  Nutritional status F.  Physical activity G. Advance directives H. List of other physicians I.  Hospitalizations, surgeries, and ER visits in previous 12 months J.  [place] such as hearing and vision if needed, cognitive and depression L. Referrals and appointments - none  In addition, I have reviewed and discussed with patient certain preventive protocols, quality metrics, and best practice recommendations. A written personalized care plan for preventive services as well as general preventive health recommendations were provided to patient.  See attached scanned questionnaire for additional information.   Signed,  Fabio Neighbors, LPN Nurse Health Advisor   Nurse Recommendations: Pt declined the Prevanr 13 vaccine and would like to speak further with PCP about this.

## 2017-12-22 NOTE — Progress Notes (Signed)
Patient: Jeffrey Hendrix., Male    DOB: Oct 19, 1924, 84 y.o.   MRN: [id number] Visit Date: 12/22/2017  Today's Provider: Wilhemena Durie, MD   Chief Complaint  Patient presents with  . Annual Exam   Subjective:  Jeffrey Hendrix. is a 84 y.o. male who presents today for health maintenance and complete physical. He feels well. He reports exercising none. He reports he is sleeping well.  Overall he and his wife are doing very well.  He feels well.  Immunization History  Administered Date(s) Administered  . Influenza, High Dose Seasonal PF 11/26/2015, 10/28/2016  . Influenza-Unspecified 11/23/2017  . Pneumococcal Polysaccharide-23 01/25/1992  . Td 01/22/2010  . Tdap 08/02/2012  . Zoster 01/16/2013     Review of Systems  Constitutional: Negative.   HENT: Negative.   Eyes: Positive for visual disturbance (glaucoma).  Respiratory: Negative.   Cardiovascular: Negative.   Gastrointestinal: Negative.   Endocrine: Negative.   Genitourinary: Negative.   Musculoskeletal: Negative.   Skin: Negative.   Allergic/Immunologic: Negative.   Neurological: Positive for tremors.  Hematological: Negative.   Psychiatric/Behavioral: Negative.     Social History   Socioeconomic History  . Marital status: Married    Spouse name: Not on file  . Number of children: 2  . Years of education: Not on file  . Highest education level: Bachelor's degree (e.g., BA, AB, BS)  Occupational History  . Occupation: retired  Scientific laboratory technician  . Financial resource strain: Not hard at all  . Food insecurity:    Worry: Never true    Inability: Never true  . Transportation needs:    Medical: No    Non-medical: No  Tobacco Use  . Smoking status: Never Smoker  . Smokeless tobacco: Never Used  Substance and Sexual Activity  . Alcohol use: Not Currently  . Drug use: No  . Sexual activity: Not on file  Lifestyle  . Physical activity:    Days per week: 0 days    Minutes per session: 0 min  .  Stress: Not at all  Relationships  . Social connections:    Talks on phone: Patient refused    Gets together: Patient refused    Attends religious service: Patient refused    Active member of club or organization: Patient refused    Attends meetings of clubs or organizations: Patient refused    Relationship status: Patient refused  . Intimate partner violence:    Fear of current or ex partner: Patient refused    Emotionally abused: Patient refused    Physically abused: Patient refused    Forced sexual activity: Patient refused  Other Topics Concern  . Not on file  Social History Narrative  . Not on file    Patient Active Problem List   Diagnosis Date Noted  . URI (upper respiratory infection) 06/19/2017  . Benign fibroma of prostate 11/28/2014  . Colon polyp 11/28/2014  . Essential (primary) hypertension 11/28/2014  . Bergmann's syndrome 11/28/2014  . HLD (hyperlipidemia) 11/28/2014  . Adult hypothyroidism 11/28/2014  . Esophagitis, reflux 11/28/2014  . Has a tremor 11/28/2014  . Glaucoma 04/19/2014  . Personal history of colonic polyps 09/28/2013  . History of colon polyps 09/28/2013    Past Surgical History:  Procedure Laterality Date  . CATARACT EXTRACTION  2012, 2013  . COLON RESECTION  2009  . COLONOSCOPY  09-14-08,09/2013.   Dr Jamal Collin  . COLONOSCOPY WITH PROPOFOL N/A 10/31/2014   Procedure: COLONOSCOPY WITH PROPOFOL;  Surgeon: Andreas Newport  Jamal Collin, MD;  Location: [facility];  Service: Endoscopy;  Laterality: N/A;  . [place]    His family history includes Colon cancer in his father and sister; Diabetes in his sister; Heart disease in his sister and sister; Lung cancer in his brother and sister; Stroke in his mother.     Outpatient Encounter Medications as of 12/22/2017  Medication Sig  . hydrochlorothiazide (HYDRODIURIL) 12.5 MG tablet TAKE 1/2 TABLET EVERY DAY (Patient taking differently: Take 6.25 mg by mouth daily. )  . latanoprost  (XALATAN) 0.005 % ophthalmic solution Place 1 drop into both eyes at bedtime.  Marland Kitchen levothyroxine (SYNTHROID, LEVOTHROID) 50 MCG tablet Take 1 tablet (50 mcg total) by mouth daily.  . simvastatin (ZOCOR) 10 MG tablet TAKE 1 TABLET  DAILY AT 6 PM (Patient taking differently: Take 10 mg by mouth every evening. )  . timolol (TIMOPTIC) 0.5 % ophthalmic solution Place 1 drop into both eyes 2 (two) times daily.    No facility-administered encounter medications on file as of 12/22/2017.     Patient Care Team: Jerrol Banana., MD as PCP - General (Family Medicine) Leandrew Koyanagi, MD as Referring Physician (Ophthalmology) Dasher, Rayvon Char, MD (Dermatology)      Objective:   Vitals:  Vitals:   12/22/17 0940  BP: (!) 156/78  Pulse: 69  Resp: 16  Temp: 97.9 F (36.6 C)  TempSrc: Oral  Weight: 168 lb (76.2 kg)  Height: 6' (1.829 m)    Physical Exam  Constitutional: He is oriented to person, place, and time. He appears well-developed and well-nourished.  HENT:  Head: Normocephalic and atraumatic.  Right Ear: External ear normal.  Left Ear: External ear normal.  Nose: Nose normal.  Mouth/Throat: Oropharynx is clear and moist.  Eyes: Pupils are equal, round, and reactive to light. Conjunctivae and EOM are normal.  Neck: Normal range of motion. Neck supple.  Cardiovascular: Normal rate, regular rhythm, normal heart sounds and intact distal pulses.  Pulmonary/Chest: Effort normal and breath sounds normal.  Abdominal: Soft. Bowel sounds are normal.  Genitourinary: Rectum normal, prostate normal and penis normal.  Musculoskeletal: Normal range of motion.  Neurological: He is alert and oriented to person, place, and time.  He has a very mild head tremor and bilateral hand tremor.  This appears to be more of an essential tremor than anything else.  Skin: Skin is warm and dry.  Psychiatric: He has a normal mood and affect. His behavior is normal. Judgment and thought content normal.      Depression Screen PHQ 2/9 Scores 12/22/2017 07/08/2017 12/31/2015 12/31/2014  PHQ - 2 Score 0 0 0 0      Assessment & Plan:     Routine Health Maintenance and Physical Exam  Exercise Activities and Dietary recommendations Goals    . DIET - INCREASE WATER INTAKE     Recommend to drink at least 6-8 8oz glasses of water per day.       Immunization History  Administered Date(s) Administered  . Influenza, High Dose Seasonal PF 11/26/2015, 10/28/2016  . Influenza-Unspecified 11/23/2017  . Pneumococcal Polysaccharide-23 01/25/1992  . Td 01/22/2010  . Tdap 08/02/2012  . Zoster 01/16/2013    Health Maintenance  Topic Date Due  . PNA vac Low Risk Adult (2 of 2 - PCV13) 01/24/1993  . TETANUS/TDAP  08/03/2022  . INFLUENZA VACCINE  Completed     Discussed health benefits of physical activity, and encouraged him to engage in regular exercise appropriate for  his age and condition.   1. Annual physical exam   2. Essential (primary) hypertension  - CBC with Differential/Platelet - Comprehensive metabolic panel  3. Adult hypothyroidism  - TSH  4. Hyperlipidemia, unspecified hyperlipidemia type  - Lipid Panel With LDL/HDL Ratio 5.Benign essential tremor  I have done the exam and reviewed the chart and it is accurate to the best of my knowledge. Development worker, community has been used and  any errors in dictation or transcription are unintentional. Miguel Aschoff M.D. [facility]

## 2017-12-23 LAB — CBC WITH DIFFERENTIAL/PLATELET
BASOS ABS: 0 10*3/uL (ref 0.0–0.2)
Basos: 1 %
EOS (ABSOLUTE): 0.2 10*3/uL (ref 0.0–0.4)
Eos: 4 %
HEMOGLOBIN: 15.8 g/dL (ref 13.0–17.7)
Hematocrit: 47 % (ref 37.5–51.0)
IMMATURE GRANS (ABS): 0 10*3/uL (ref 0.0–0.1)
Immature Granulocytes: 0 %
LYMPHS: 33 %
Lymphocytes Absolute: 1.9 10*3/uL (ref 0.7–3.1)
MCH: 30.7 pg (ref 26.6–33.0)
MCHC: 33.6 g/dL (ref 31.5–35.7)
MCV: 91 fL (ref 79–97)
MONOCYTES: 11 %
Monocytes Absolute: 0.6 10*3/uL (ref 0.1–0.9)
NEUTROS ABS: 3 10*3/uL (ref 1.4–7.0)
NEUTROS PCT: 51 %
PLATELETS: 293 10*3/uL (ref 150–450)
RBC: 5.15 x10E6/uL (ref 4.14–5.80)
RDW: 12.7 % (ref 12.3–15.4)
WBC: 5.8 10*3/uL (ref 3.4–10.8)

## 2017-12-23 LAB — COMPREHENSIVE METABOLIC PANEL
ALK PHOS: 79 IU/L (ref 39–117)
ALT: 11 IU/L (ref 0–44)
AST: 14 IU/L (ref 0–40)
Albumin/Globulin Ratio: 1.6 (ref 1.2–2.2)
Albumin: 4.1 g/dL (ref 3.2–4.6)
BILIRUBIN TOTAL: 0.7 mg/dL (ref 0.0–1.2)
BUN/Creatinine Ratio: 11 (ref 10–24)
BUN: 12 mg/dL (ref 10–36)
CHLORIDE: 101 mmol/L (ref 96–106)
CO2: 25 mmol/L (ref 20–29)
CREATININE: 1.09 mg/dL (ref 0.76–1.27)
Calcium: 9.3 mg/dL (ref 8.6–10.2)
GFR calc non Af Amer: 59 mL/min/{1.73_m2} — ABNORMAL LOW (ref >59)
GFR, EST AFRICAN AMERICAN: 68 mL/min/{1.73_m2} (ref >59)
Globulin, Total: 2.6 g/dL (ref 1.5–4.5)
Glucose: 96 mg/dL (ref 65–99)
Potassium: 4.2 mmol/L (ref 3.5–5.2)
Sodium: 140 mmol/L (ref 134–144)
TOTAL PROTEIN: 6.7 g/dL (ref 6.0–8.5)

## 2017-12-23 LAB — LIPID PANEL WITH LDL/HDL RATIO
CHOLESTEROL TOTAL: 167 mg/dL (ref 100–199)
HDL: 58 mg/dL (ref >39)
LDL CALC: 89 mg/dL (ref 0–99)
LDl/HDL Ratio: 1.5 ratio (ref 0.0–3.6)
Triglycerides: 99 mg/dL (ref 0–149)
VLDL CHOLESTEROL CAL: 20 mg/dL (ref 5–40)

## 2017-12-23 LAB — TSH: TSH: 3.3 u[IU]/mL (ref 0.450–4.500)

## 2017-12-27 ENCOUNTER — Telehealth: Payer: Self-pay

## 2017-12-27 NOTE — Telephone Encounter (Signed)
Patient advised as directed below. Patient requested a copy of his labs and they have been placed up front ready for pick up.  Thanks,  -Joseline

## 2017-12-27 NOTE — Telephone Encounter (Signed)
-----   Message from Jerrol Banana., MD sent at 12/27/2017  8:20 AM EST ----- All stable.

## 2018-02-21 ENCOUNTER — Other Ambulatory Visit: Payer: Self-pay | Admitting: Family Medicine

## 2018-03-09 DIAGNOSIS — D2261 Melanocytic nevi of right upper limb, including shoulder: Secondary | ICD-10-CM | POA: Diagnosis not present

## 2018-03-09 DIAGNOSIS — L820 Inflamed seborrheic keratosis: Secondary | ICD-10-CM | POA: Diagnosis not present

## 2018-03-09 DIAGNOSIS — Z08 Encounter for follow-up examination after completed treatment for malignant neoplasm: Secondary | ICD-10-CM | POA: Diagnosis not present

## 2018-03-09 DIAGNOSIS — D2262 Melanocytic nevi of left upper limb, including shoulder: Secondary | ICD-10-CM | POA: Diagnosis not present

## 2018-03-09 DIAGNOSIS — L538 Other specified erythematous conditions: Secondary | ICD-10-CM | POA: Diagnosis not present

## 2018-03-09 DIAGNOSIS — D225 Melanocytic nevi of trunk: Secondary | ICD-10-CM | POA: Diagnosis not present

## 2018-03-09 DIAGNOSIS — Z85828 Personal history of other malignant neoplasm of skin: Secondary | ICD-10-CM | POA: Diagnosis not present

## 2018-06-23 ENCOUNTER — Other Ambulatory Visit: Payer: Self-pay

## 2018-06-23 ENCOUNTER — Ambulatory Visit (INDEPENDENT_AMBULATORY_CARE_PROVIDER_SITE_OTHER): Payer: Medicare HMO | Admitting: Family Medicine

## 2018-06-23 ENCOUNTER — Encounter: Payer: Self-pay | Admitting: Family Medicine

## 2018-06-23 VITALS — BP 138/70 | HR 70 | Temp 97.7°F | Resp 16 | Wt 168.4 lb

## 2018-06-23 DIAGNOSIS — E785 Hyperlipidemia, unspecified: Secondary | ICD-10-CM | POA: Diagnosis not present

## 2018-06-23 DIAGNOSIS — I10 Essential (primary) hypertension: Secondary | ICD-10-CM

## 2018-06-23 DIAGNOSIS — R251 Tremor, unspecified: Secondary | ICD-10-CM

## 2018-06-23 DIAGNOSIS — Z86010 Personal history of colonic polyps: Secondary | ICD-10-CM | POA: Diagnosis not present

## 2018-06-23 DIAGNOSIS — E039 Hypothyroidism, unspecified: Secondary | ICD-10-CM

## 2018-06-23 NOTE — Progress Notes (Signed)
Patient: Jeffrey Hendrix. Male    DOB: Jun 04, 1924   85 y.o.   MRN: [id number] Visit Date: 06/23/2018  Today's Provider: Wilhemena Durie, MD   Chief Complaint  Patient presents with  . Hypertension  . Hyperlipidemia  . Hypothyroidism   Subjective:     HPI  Hypertension, follow-up:  BP Readings from Last 3 Encounters:  06/23/18 138/70  12/22/17 (!) 156/78  12/22/17 (!) 156/78    He was last seen for hypertension 6 months ago.  BP at that visit was 156/78. Management changes since that visit include none. He reports excellent compliance with treatment. He is not having side effects.  He is not exercising. He is not adherent to low salt diet.   Outside blood pressures are on average systolic 035-465K and diastolic in the lower 81E. He is not experiencing chest pain, chest pressure/discomfort, claudication, dyspnea, exertional chest pressure/discomfort, fatigue, irregular heart beat, lower extremity edema, near-syncope, orthopnea, palpitations, paroxysmal nocturnal dyspnea, syncope and tachypnea.   Cardiovascular risk factors include advanced age (older than 65 for men, 47 for women), hypertension and male gender.  Use of agents associated with hypertension: none.     Weight trend: stable Wt Readings from Last 3 Encounters:  06/23/18 168 lb 6.4 oz (76.4 kg)  12/22/17 168 lb (76.2 kg)  12/22/17 168 lb 9.6 oz (76.5 kg)    Current diet: well balanced  ------------------------------------------------------------------------  Hypothyroid, follow-up:  TSH  Date Value Ref Range Status  12/22/2017 3.300 0.450 - 4.500 uIU/mL Final  12/15/2016 3.19 0.40 - 4.50 mIU/L Final  05/05/2016 2.870 0.450 - 4.500 uIU/mL Final   Wt Readings from Last 3 Encounters:  06/23/18 168 lb 6.4 oz (76.4 kg)  12/22/17 168 lb (76.2 kg)  12/22/17 168 lb 9.6 oz (76.5 kg)    He was last seen for hypothyroid 6 months ago.  Management since that visit includes none. He reports  excellent compliance with treatment. He is not having side effects.  He is not exercising. He is experiencing none He denies change in energy level, diarrhea, heat / cold intolerance, nervousness, palpitations and weight changes Weight trend: stable  ------------------------------------------------------------------------  Lipid/Cholesterol, Follow-up:   Last seen for this6 months ago.  Management changes since that visit include none. . Last Lipid Panel:    Component Value Date/Time   CHOL 167 12/22/2017 1049   TRIG 99 12/22/2017 1049   HDL 58 12/22/2017 1049   CHOLHDL 2.9 12/15/2016 1146   LDLCALC 89 12/22/2017 1049   LDLCALC 83 12/15/2016 1146    Risk factors for vascular disease include hypercholesterolemia and hypertension  He reports excellent compliance with treatment. He is not having side effects.  Current symptoms include none and have been unchanged. Weight trend: stable Prior visit with dietician: no Current diet: well balanced Current exercise: none  Wt Readings from Last 3 Encounters:  06/23/18 168 lb 6.4 oz (76.4 kg)  12/22/17 168 lb (76.2 kg)  12/22/17 168 lb 9.6 oz (76.5 kg)    -------------------------------------------------------------------  Allergies  Allergen Reactions  . Doxycycline   . Latex   . Omeprazole Swelling  . Ranitidine     Developed bad heartburn and stomach pain  . Sulfa Antibiotics   . Benadryl [Diphenhydramine]     Feels "uneasy"  . Pce  [Erythromycin] Rash     Current Outpatient Medications:  .  hydrochlorothiazide (HYDRODIURIL) 12.5 MG tablet, TAKE 1/2 TABLET EVERY DAY (Patient taking differently: Take 6.25 mg by  mouth daily. ), Disp: 45 tablet, Rfl: 12 .  latanoprost (XALATAN) 0.005 % ophthalmic solution, Place 1 drop into both eyes at bedtime., Disp: , Rfl:  .  levothyroxine (SYNTHROID, LEVOTHROID) 50 MCG tablet, Take 1 tablet (50 mcg total) by mouth daily., Disp: 90 tablet, Rfl: 3 .  simvastatin (ZOCOR) 10 MG  tablet, Take 1 tablet (10 mg total) by mouth every evening., Disp: 90 tablet, Rfl: 3 .  timolol (TIMOPTIC) 0.5 % ophthalmic solution, Place 1 drop into both eyes 2 (two) times daily. , Disp: , Rfl:   Review of Systems  Constitutional: Negative.   Cardiovascular: Negative.   Gastrointestinal: Negative.   Endocrine: Negative.   Musculoskeletal: Negative.   Allergic/Immunologic: Negative.   Neurological: Negative.   Psychiatric/Behavioral: Negative.     Social History   Tobacco Use  . Smoking status: Never Smoker  . Smokeless tobacco: Never Used  Substance Use Topics  . Alcohol use: Not Currently      Objective:   BP 138/70   Pulse 70   Temp 97.7 F (36.5 C) (Oral)   Resp 16   Wt 168 lb 6.4 oz (76.4 kg)   BMI 22.84 kg/m  Vitals:   06/23/18 1030  BP: 138/70  Pulse: 70  Resp: 16  Temp: 97.7 F (36.5 C)  TempSrc: Oral  Weight: 168 lb 6.4 oz (76.4 kg)     Physical Exam Vitals signs reviewed.  Constitutional:      Appearance: He is well-developed.  HENT:     Head: Normocephalic and atraumatic.  Eyes:     General: No scleral icterus.    Conjunctiva/sclera: Conjunctivae normal.  Neck:     Thyroid: No thyromegaly.  Cardiovascular:     Rate and Rhythm: Normal rate and regular rhythm.     Heart sounds: Normal heart sounds.  Pulmonary:     Effort: Pulmonary effort is normal.     Breath sounds: Normal breath sounds.  Abdominal:     Palpations: Abdomen is soft.  Musculoskeletal:     Right lower leg: No edema.     Left lower leg: No edema.  Skin:    General: Skin is warm and dry.  Neurological:     Mental Status: He is alert and oriented to person, place, and time.  Psychiatric:        Mood and Affect: Mood normal.        Behavior: Behavior normal.        Thought Content: Thought content normal.        Judgment: Judgment normal.         Assessment & Plan    1. Essential (primary) hypertension Controlled.   2. Adult hypothyroidism   3. Has a  tremor   4. Hyperlipidemia, unspecified hyperlipidemia type On zocor.  5. History of colon polyps Pt says he wants to see GI again for another colonoscopy for f/u. I do not think this is necessary for 85 yo.     Meli Faley Cranford Mon, MD  [facility] Patient seen and examined by......, note scribed by Jennings Books, [place]

## 2018-06-30 DIAGNOSIS — H401133 Primary open-angle glaucoma, bilateral, severe stage: Secondary | ICD-10-CM | POA: Diagnosis not present

## 2018-07-21 ENCOUNTER — Other Ambulatory Visit: Payer: Self-pay | Admitting: Family Medicine

## 2018-08-01 ENCOUNTER — Telehealth: Payer: Self-pay | Admitting: Family Medicine

## 2018-08-01 NOTE — Chronic Care Management (AMB) (Signed)
Chronic Care Management   Note  08/01/2018 Name: Bela Nyborg. MRN: [id number] DOB: 1924/08/05  Zada Girt Rupert Azzara. is a 85 y.o. year old male who is a primary care patient of Jerrol Banana., MD. I reached out to [name] by phone today in response to a referral sent by Mr. Roddie Riegler Jr.'s health plan.    Mr. Portocarrero was given information about Chronic Care Management services today including:  1. CCM service includes personalized support from designated clinical staff supervised by his physician, including individualized plan of care and coordination with other care providers 2. 24/7 contact phone numbers for assistance for urgent and routine care needs. 3. Service will only be billed when office clinical staff spend 20 minutes or more in a month to coordinate care. 4. Only one practitioner may furnish and bill the service in a calendar month. 5. The patient may stop CCM services at any time (effective at the end of the month) by phone call to the office staff. 6. The patient will be responsible for cost sharing (co-pay) of up to 20% of the service fee (after annual deductible is met).  Patient did not agree to enrollment in care management services and does not wish to consider at this time.  Follow up plan: The patient has been provided with contact information for the chronic care management team and has been advised to call with any health related questions or concerns.   Arnold  [email or web address]   ??[phone]

## 2018-08-30 ENCOUNTER — Other Ambulatory Visit: Payer: Self-pay

## 2018-08-30 MED ORDER — TIMOLOL MALEATE 0.5 % OP SOLN: 1.0000 [drp] | Freq: Two times a day (BID) | OPHTHALMIC | 11 refills | Status: AC

## 2018-08-30 NOTE — Telephone Encounter (Signed)
Patient's wife is requesting a refill be sent to [facility] mail order. She states he is out of medication. Need refill ASAP. [employer] advised patient that they had sent several request with no response.

## 2018-08-31 ENCOUNTER — Telehealth: Payer: Self-pay | Admitting: Family Medicine

## 2018-09-14 ENCOUNTER — Other Ambulatory Visit: Payer: Self-pay

## 2018-09-14 ENCOUNTER — Emergency Department
Admission: EM | Admit: 2018-09-14 | Discharge: 2018-09-14 | Disposition: A | Discharge status: Home / Self Care | Payer: Medicare HMO | Attending: Emergency Medicine | Admitting: Emergency Medicine

## 2018-09-14 DIAGNOSIS — E039 Hypothyroidism, unspecified: Secondary | ICD-10-CM | POA: Diagnosis not present

## 2018-09-14 DIAGNOSIS — R21 Rash and other nonspecific skin eruption: Secondary | ICD-10-CM | POA: Diagnosis present

## 2018-09-14 DIAGNOSIS — Z91040 Latex allergy status: Secondary | ICD-10-CM | POA: Insufficient documentation

## 2018-09-14 DIAGNOSIS — Z79899 Other long term (current) drug therapy: Secondary | ICD-10-CM | POA: Insufficient documentation

## 2018-09-14 DIAGNOSIS — T7840XA Allergy, unspecified, initial encounter: Secondary | ICD-10-CM | POA: Diagnosis not present

## 2018-09-14 DIAGNOSIS — I10 Essential (primary) hypertension: Secondary | ICD-10-CM | POA: Diagnosis not present

## 2018-09-14 MED ORDER — DEXAMETHASONE SODIUM PHOSPHATE 10 MG/ML IJ SOLN
10.0000 mg | Freq: Once | INTRAMUSCULAR | Status: AC
  Administered 2018-09-14: 22:00:00 10 mg via INTRAMUSCULAR
  Filled 2018-09-14: qty 1

## 2018-09-14 MED ORDER — PREDNISONE 50 MG PO TABS: 50.0000 mg | ORAL_TABLET | Freq: Every day | ORAL | 0 refills | Status: DC

## 2018-09-14 MED ORDER — DIPHENHYDRAMINE HCL 50 MG/ML IJ SOLN
50.0000 mg | Freq: Once | INTRAMUSCULAR | Status: AC
  Administered 2018-09-14: 50 mg via INTRAMUSCULAR
  Filled 2018-09-14: qty 1

## 2018-09-14 NOTE — ED Provider Notes (Signed)
[facility] Emergency Department Provider Note  ____________________________________________  Time seen: Approximately 10:03 PM  I have reviewed the triage vital signs and the nursing notes.   HISTORY  Chief Complaint Rash    HPI Jeffrey Hendrix. is a 85 y.o. male who presents the emergency department concern for allergic reaction.  Patient presented to the emergency department with hives to the torso, upper and lower extremities.  Patient reports that he is not aware of any food or significant medication allergies resulting in rash.  Patient has had no difficulty breathing.  No edema of the tongue or face.  Patient has reportedly large hives to the torso and extremities.  No medications prior to arrival.  Patient reports that he has been told he should not take Benadryl due to his glaucoma and has Benadryl listed as an allergy but can take same.  No other complaints at this time.         Past Medical History:  Diagnosis Date  . Cancer ([place])    PROSTATE  . Glaucoma   . Hyperlipidemia   . Hypertension     Patient Active Problem List   Diagnosis Date Noted  . URI (upper respiratory infection) 06/19/2017  . Benign fibroma of prostate 11/28/2014  . Colon polyp 11/28/2014  . Essential (primary) hypertension 11/28/2014  . Bergmann's syndrome 11/28/2014  . HLD (hyperlipidemia) 11/28/2014  . Adult hypothyroidism 11/28/2014  . Esophagitis, reflux 11/28/2014  . Has a tremor 11/28/2014  . Glaucoma 04/19/2014  . Personal history of colonic polyps 09/28/2013  . History of colon polyps 09/28/2013    Past Surgical History:  Procedure Laterality Date  . CATARACT EXTRACTION  2012, 2013  . COLON RESECTION  2009  . COLONOSCOPY  09-14-08,09/2013.   Dr Jamal Collin  . COLONOSCOPY WITH PROPOFOL N/A 10/31/2014   Procedure: COLONOSCOPY WITH PROPOFOL;  Surgeon: Christene Lye, MD;  Location: [facility] ENDOSCOPY;  Service: Endoscopy;  Laterality: N/A;  . [place]    Prior to Admission medications   Medication Sig Start Date End Date Taking? Authorizing Provider  hydrochlorothiazide (HYDRODIURIL) 12.5 MG tablet TAKE 1/2 TABLET EVERY DAY Patient taking differently: Take 6.25 mg by mouth daily.  10/28/17   Jerrol Banana., MD  latanoprost (XALATAN) 0.005 % ophthalmic solution Place 1 drop into both eyes at bedtime. 09/17/17   [provider]  levothyroxine (SYNTHROID) 50 MCG tablet TAKE 1 TABLET (50 MCG TOTAL) BY MOUTH DAILY. 07/22/18   Jerrol Banana., MD  predniSONE (DELTASONE) 50 MG tablet Take 1 tablet (50 mg total) by mouth daily with breakfast. 09/14/18   Jeylin Woodmansee, Charline Bills, PA-C  simvastatin (ZOCOR) 10 MG tablet Take 1 tablet (10 mg total) by mouth every evening. 02/21/18   Jerrol Banana., MD  timolol (TIMOPTIC) 0.5 % ophthalmic solution Place 1 drop into both eyes 2 (two) times daily. 08/30/18   Jerrol Banana., MD    Allergies Doxycycline, Latex, Omeprazole, Ranitidine, Sulfa antibiotics, Benadryl [diphenhydramine], and Pce  [erythromycin]  Family History  Problem Relation Age of Onset  . Stroke Mother   . Colon cancer Father   . Colon cancer Sister   . Lung cancer Sister   . Lung cancer Brother   . Diabetes Sister   . Heart disease Sister   . Heart disease Sister     Social History Social History   Tobacco Use  . Smoking status: Never Smoker  . Smokeless tobacco: Never Used  Substance Use Topics  . Alcohol use: Not Currently  . Drug use: No     Review of Systems  Constitutional: No fever/chills Eyes: No visual changes. No discharge ENT: No upper respiratory complaints. Cardiovascular: no chest pain. Respiratory: no cough. No SOB. Gastrointestinal: No abdominal pain.  No nausea, no vomiting.  No diarrhea.  No constipation. Musculoskeletal: Negative for musculoskeletal pain. Skin: Large wheals to the torso and extremities. Neurological: Negative for headaches, focal  weakness or numbness. 10-point ROS otherwise negative.  ____________________________________________   PHYSICAL EXAM:  VITAL SIGNS: ED Triage Vitals  Enc Vitals Group     BP 09/14/18 2129 (!) 171/113     Pulse Rate 09/14/18 2129 85     Resp 09/14/18 2129 20     Temp 09/14/18 2129 97.8 F (36.6 C)     Temp Source 09/14/18 2129 Oral     SpO2 09/14/18 2129 97 %     Weight 09/14/18 2130 170 lb (77.1 kg)     Height 09/14/18 2130 6' (1.829 m)     Head Circumference --      Peak Flow --      Pain Score 09/14/18 2130 0     Pain Loc --      Pain Edu? --      Excl. in [place]? --      Constitutional: Alert and oriented. Well appearing and in no acute distress. Eyes: Conjunctivae are normal. PERRL. EOMI. Head: Atraumatic. ENT:      Ears:       Nose: No congestion/rhinnorhea.      Mouth/Throat: Mucous membranes are moist.  No oropharyngeal edema or erythema. Neck: No stridor.    Cardiovascular: Normal rate, regular rhythm. Normal S1 and S2.  Good peripheral circulation. Respiratory: Normal respiratory effort without tachypnea or retractions. Lungs CTAB. Good air entry to the bases with no decreased or absent breath sounds. Musculoskeletal: Full range of motion to all extremities. No gross deformities appreciated. Neurologic:  Normal speech and language. No gross focal neurologic deficits are appreciated.  Skin:  Skin is warm, dry and intact.  Patient has wheals noted to the torso and all 4 extremities.  No petechial lesions.  No excoriations.  No evidence of cellulitic changes.  Wheals spare the neck and face.  No perioral or periocular edema. Psychiatric: Mood and affect are normal. Speech and behavior are normal. Patient exhibits appropriate insight and judgement.   ____________________________________________   LABS (all labs ordered are listed, but only abnormal results are displayed)  Labs Reviewed - No data to  display ____________________________________________  EKG   ____________________________________________  RADIOLOGY   No results found.  ____________________________________________    PROCEDURES  Procedure(s) performed:    Procedures    Medications  dexamethasone (DECADRON) injection 10 mg (has no administration in time range)  diphenhydrAMINE (BENADRYL) injection 50 mg (has no administration in time range)     ____________________________________________   INITIAL IMPRESSION / ASSESSMENT AND PLAN / ED COURSE  Pertinent labs & imaging results that were available during my care of the patient were reviewed by me and considered in my medical decision making (see chart for details).  Review of the [place] CSRS was performed in accordance of the [place] prior to dispensing any controlled drugs.           Patient's diagnosis is consistent with allergic reaction.  Patient presented to the emergency department with wheals to torso and extremity.  Symptoms began 2 hours ago and rapidly spread.  No  facial involvement.  No angioedema.  Patient is given Decadron and diphenhydramine in emergency department.  He is allergic to ranitidine so famotidine is not administered here in the emergency department.  Patient be placed on course of steroids at home..  Differential included allergic reaction, contact dermatitis, cellulitis, petechial lesions.  Patient is given ED precautions to return to the ED for any worsening or new symptoms.     ____________________________________________  FINAL CLINICAL IMPRESSION(S) / ED DIAGNOSES  Final diagnoses:  Allergic reaction, initial encounter      NEW MEDICATIONS STARTED DURING THIS VISIT:  ED Discharge Orders         Ordered    predniSONE (DELTASONE) 50 MG tablet  Daily with breakfast     09/14/18 2212              This chart was dictated using voice recognition software/Dragon. Despite best efforts to proofread, errors can  occur which can change the meaning. Any change was purely unintentional.    Darletta Moll, PA-C 09/14/18 2212    Lavonia Drafts, MD 09/14/18 2259

## 2018-09-14 NOTE — ED Triage Notes (Signed)
Pt with rash with hives throughout states does itch. Unsure of cause thinks it may be due to mosquito bites. Has tried otc meds with some relief.

## 2018-09-15 ENCOUNTER — Telehealth: Payer: Self-pay

## 2018-09-15 NOTE — Telephone Encounter (Signed)
Patient's wife Cyndra Numbers called wanting to know if Dr. Rosanna Randy recommend patient take Allegra instead of Benadryl for allergic reaction due to his age? Patient was in the ER last night for Allergic reaction and was told to take Benadryl along with the prednisone, but their pharmacist says that because of his age Delma Freeze was better for him to take. Patient wants to know what Dr. Rosanna Randy thinks. Patient would like a call back today.

## 2018-09-16 ENCOUNTER — Telehealth: Payer: Self-pay

## 2018-09-16 ENCOUNTER — Ambulatory Visit: Payer: Self-pay | Admitting: Physician Assistant

## 2018-09-16 DIAGNOSIS — L508 Other urticaria: Secondary | ICD-10-CM | POA: Diagnosis not present

## 2018-09-16 DIAGNOSIS — S70361A Insect bite (nonvenomous), right thigh, initial encounter: Secondary | ICD-10-CM | POA: Diagnosis not present

## 2018-09-16 DIAGNOSIS — S80262A Insect bite (nonvenomous), left knee, initial encounter: Secondary | ICD-10-CM | POA: Diagnosis not present

## 2018-09-16 NOTE — Telephone Encounter (Signed)
Encounter error

## 2018-09-16 NOTE — Telephone Encounter (Signed)
Advised patient's wife as below.  

## 2018-09-16 NOTE — Telephone Encounter (Signed)
Safr but might not be as effective--he can take both with maybe less benadryl due to taking allegra.

## 2018-09-16 NOTE — Telephone Encounter (Signed)
Please review. Thanks!  

## 2018-10-10 ENCOUNTER — Ambulatory Visit (INDEPENDENT_AMBULATORY_CARE_PROVIDER_SITE_OTHER): Payer: Medicare HMO | Admitting: Family Medicine

## 2018-10-10 ENCOUNTER — Encounter: Payer: Self-pay | Admitting: Family Medicine

## 2018-10-10 ENCOUNTER — Other Ambulatory Visit: Payer: Self-pay

## 2018-10-10 VITALS — BP 132/78 | HR 70 | Temp 97.9°F | Resp 14 | Ht 72.0 in | Wt 164.0 lb

## 2018-10-10 DIAGNOSIS — R300 Dysuria: Secondary | ICD-10-CM | POA: Diagnosis not present

## 2018-10-10 DIAGNOSIS — R634 Abnormal weight loss: Secondary | ICD-10-CM | POA: Diagnosis not present

## 2018-10-10 DIAGNOSIS — I10 Essential (primary) hypertension: Secondary | ICD-10-CM | POA: Diagnosis not present

## 2018-10-10 DIAGNOSIS — R251 Tremor, unspecified: Secondary | ICD-10-CM | POA: Diagnosis not present

## 2018-10-10 DIAGNOSIS — Z860100 Personal history of colon polyps, unspecified: Secondary | ICD-10-CM

## 2018-10-10 DIAGNOSIS — Z23 Encounter for immunization: Secondary | ICD-10-CM

## 2018-10-10 LAB — POCT URINALYSIS DIPSTICK
Bilirubin, UA: NEGATIVE
Blood, UA: NEGATIVE
Glucose, UA: NEGATIVE
Ketones, UA: NEGATIVE
Leukocytes, UA: NEGATIVE
Nitrite, UA: NEGATIVE
Protein, UA: NEGATIVE
Spec Grav, UA: 1.015 (ref 1.010–1.025)
Urobilinogen, UA: 0.2 E.U./dL
pH, UA: 6.5 (ref 5.0–8.0)

## 2018-10-10 NOTE — Patient Instructions (Signed)
Try cranberry juice twice a day.

## 2018-10-10 NOTE — Progress Notes (Signed)
Patient: Jeffrey Hendrix. Male    DOB: 07-01-1924   85 y.o.   MRN: [id number] Visit Date: 10/10/2018  Today's Provider: Wilhemena Durie, MD   Chief Complaint  Patient presents with   Dysuria   Subjective:   HPI Patient comes in today c/o dysuria. He reports that this has been ongoing for at least 1 month. He denies any abdominal pain, flank pain, or lower back pain. He denies hematuria. He reports that he usually has symptoms first thing in the morning, but it gets better throughout the day.  The burning he is describing is just the tip of the penis.  No actual dysuria.  No fever or hematuria. He has a couple of other concerns.  He has had unintentional weight loss during this COVID epidemic of about 4 to 5 pounds.  He is worried about this.  He also wants a follow-up colonoscopy from 4 years ago.  This was through Dr. Jamal Collin  who has retired.  He is having no GI symptoms at all. BP Readings from Last 3 Encounters:  10/10/18 132/78  09/14/18 (!) 167/89  06/23/18 138/70   Wt Readings from Last 3 Encounters:  10/10/18 164 lb (74.4 kg)  09/14/18 170 lb (77.1 kg)  06/23/18 168 lb 6.4 oz (76.4 kg)    Allergies  Allergen Reactions   Doxycycline    Latex    Omeprazole Swelling   Ranitidine     Developed bad heartburn and stomach pain   Sulfa Antibiotics    Benadryl [Diphenhydramine]     Feels "uneasy"   Pce  [Erythromycin] Rash     Current Outpatient Medications:    hydrochlorothiazide (HYDRODIURIL) 12.5 MG tablet, TAKE 1/2 TABLET EVERY DAY (Patient taking differently: Take 6.25 mg by mouth daily. ), Disp: 45 tablet, Rfl: 12   latanoprost (XALATAN) 0.005 % ophthalmic solution, Place 1 drop into both eyes at bedtime., Disp: , Rfl:    levothyroxine (SYNTHROID) 50 MCG tablet, TAKE 1 TABLET (50 MCG TOTAL) BY MOUTH DAILY., Disp: 90 tablet, Rfl: 3   simvastatin (ZOCOR) 10 MG tablet, Take 1 tablet (10 mg total) by mouth every evening., Disp: 90 tablet,  Rfl: 3   timolol (TIMOPTIC) 0.5 % ophthalmic solution, Place 1 drop into both eyes 2 (two) times daily., Disp: 10 mL, Rfl: 11   predniSONE (DELTASONE) 50 MG tablet, Take 1 tablet (50 mg total) by mouth daily with breakfast. (Patient not taking: Reported on 10/10/2018), Disp: 5 tablet, Rfl: 0  Review of Systems  Constitutional: Positive for unexpected weight change. Negative for activity change and fatigue.  Eyes: Negative.   Respiratory: Negative.   Cardiovascular: Negative.   Gastrointestinal: Negative for abdominal pain and nausea.  Endocrine: Negative.   Genitourinary: Positive for dysuria. Negative for decreased urine volume, discharge, flank pain, frequency, hematuria, penile pain, penile swelling, scrotal swelling, testicular pain and urgency.  Allergic/Immunologic: Negative.   Neurological: Negative.   Hematological: Negative.   Psychiatric/Behavioral: Negative.     Social History   Tobacco Use   Smoking status: Never Smoker   Smokeless tobacco: Never Used  Substance Use Topics   Alcohol use: Not Currently      Objective:   BP 132/78    Pulse 70    Temp 97.9 F (36.6 C)    Resp 14    Ht 6' (1.829 m)    Wt 164 lb (74.4 kg)    SpO2 97%    BMI 22.24 kg/m  Vitals:   10/10/18 0812  BP: 132/78  Pulse: 70  Resp: 14  Temp: 97.9 F (36.6 C)  SpO2: 97%  Weight: 164 lb (74.4 kg)  Height: 6' (1.829 m)  Body mass index is 22.24 kg/m.   Physical Exam Vitals signs reviewed.  Constitutional:      Appearance: He is well-developed.  HENT:     Head: Normocephalic and atraumatic.     Right Ear: External ear normal.     Left Ear: External ear normal.     Nose: Nose normal.  Eyes:     Conjunctiva/sclera: Conjunctivae normal.     Pupils: Pupils are equal, round, and reactive to light.  Neck:     Musculoskeletal: Normal range of motion and neck supple.  Cardiovascular:     Rate and Rhythm: Normal rate and regular rhythm.     Heart sounds: Normal heart sounds.    Pulmonary:     Effort: Pulmonary effort is normal.     Breath sounds: Normal breath sounds.  Abdominal:     General: Bowel sounds are normal.     Palpations: Abdomen is soft.  Genitourinary:    Penis: Normal.      Comments: There might be a mild irritation of the opening of the urethral meatus.  No rash or abnormal exam otherwise. Musculoskeletal: Normal range of motion.  Skin:    General: Skin is warm and dry.  Neurological:     Mental Status: He is alert and oriented to person, place, and time.     Comments: He has a very mild head tremor and bilateral hand tremor.  This appears to be more of an essential tremor than anything else.  Psychiatric:        Behavior: Behavior normal.        Thought Content: Thought content normal.        Judgment: Judgment normal.      No results found for any visits on 10/10/18.     Assessment & Plan    1. Dysuria No sign of infection.  We will have him use cranberry juice and recheck in 1 month. - POCT urinalysis dipstick  2. Weight loss He has lost 4 pounds since the early summer.  COVID is been ongoing and may have impacted with eating. He looks good and feels well.  Return to clinic 1 to 2 months - CBC with Differential/Platelet - Comprehensive metabolic panel - TSH  3. Flu vaccine need  - Flu Vaccine QUAD High Dose(Fluad)  4. History of colon polyps He says he has a family history of Colon cancer.  Check labs in follow-up.  Consider GI referral although at 61 I told him it was probably not an appropriate thing to do at this time.  He is having no GI symptoms.  Consider Cologuard on next visit  5. Has a tremor Head tremor  6. Essential (primary) hypertension Controlled.More than 50% 25 minute visit spent in counseling or coordination of care      Wilhemena Durie, MD  [facility]

## 2018-10-11 LAB — CBC WITH DIFFERENTIAL/PLATELET
Basophils Absolute: 0 10*3/uL (ref 0.0–0.2)
Basos: 1 %
EOS (ABSOLUTE): 0.3 10*3/uL (ref 0.0–0.4)
Eos: 5 %
Hematocrit: 47.4 % (ref 37.5–51.0)
Hemoglobin: 16.1 g/dL (ref 13.0–17.7)
Immature Grans (Abs): 0 10*3/uL (ref 0.0–0.1)
Immature Granulocytes: 0 %
Lymphocytes Absolute: 1.8 10*3/uL (ref 0.7–3.1)
Lymphs: 32 %
MCH: 30.7 pg (ref 26.6–33.0)
MCHC: 34 g/dL (ref 31.5–35.7)
MCV: 90 fL (ref 79–97)
Monocytes Absolute: 0.6 10*3/uL (ref 0.1–0.9)
Monocytes: 11 %
Neutrophils Absolute: 2.9 10*3/uL (ref 1.4–7.0)
Neutrophils: 51 %
Platelets: 308 10*3/uL (ref 150–450)
RBC: 5.25 x10E6/uL (ref 4.14–5.80)
RDW: 13.2 % (ref 11.6–15.4)
WBC: 5.7 10*3/uL (ref 3.4–10.8)

## 2018-10-11 LAB — TSH: TSH: 3.8 u[IU]/mL (ref 0.450–4.500)

## 2018-10-11 LAB — COMPREHENSIVE METABOLIC PANEL
ALT: 10 IU/L (ref 0–44)
AST: 13 IU/L (ref 0–40)
Albumin/Globulin Ratio: 1.6 (ref 1.2–2.2)
Albumin: 4 g/dL (ref 3.5–4.6)
Alkaline Phosphatase: 83 IU/L (ref 39–117)
BUN/Creatinine Ratio: 12 (ref 10–24)
BUN: 13 mg/dL (ref 10–36)
Bilirubin Total: 0.8 mg/dL (ref 0.0–1.2)
CO2: 26 mmol/L (ref 20–29)
Calcium: 9.2 mg/dL (ref 8.6–10.2)
Chloride: 100 mmol/L (ref 96–106)
Creatinine, Ser: 1.11 mg/dL (ref 0.76–1.27)
GFR calc Af Amer: 66 mL/min/{1.73_m2} (ref >59)
GFR calc non Af Amer: 57 mL/min/{1.73_m2} — ABNORMAL LOW (ref >59)
Globulin, Total: 2.5 g/dL (ref 1.5–4.5)
Glucose: 98 mg/dL (ref 65–99)
Potassium: 4.1 mmol/L (ref 3.5–5.2)
Sodium: 139 mmol/L (ref 134–144)
Total Protein: 6.5 g/dL (ref 6.0–8.5)

## 2018-10-27 ENCOUNTER — Ambulatory Visit: Payer: Self-pay | Admitting: Physician Assistant

## 2018-11-08 DIAGNOSIS — H401133 Primary open-angle glaucoma, bilateral, severe stage: Secondary | ICD-10-CM | POA: Diagnosis not present

## 2018-11-16 NOTE — Progress Notes (Addendum)
Patient: Jeffrey Hendrix. Male    DOB: Jun 17, 1924   85 y.o.   MRN: [id number] Visit Date: 11/21/2018  Today's Provider: Wilhemena Durie, MD   Chief Complaint  Patient presents with  . Weight Loss  . Dysuria   Subjective:     HPI   Weight loss From 10/10/2018-Labs good. Return to clinic 1 to 2 months Pt now has gained some weight. Dysuria Patient returns for follow up from 10/10/18, patient reports today that he has had symptom of dysuria for over 2 months and states that he only has symptom in the PM.  He insists that his PSA be rechecked for h/o prostate cancer. Essential (primary) hypertension From 10/10/2018-labs good. Controlled.More than 50% 25 minute visit spent in counseling or coordination of care  Allergies  Allergen Reactions  . Doxycycline   . Latex   . Omeprazole Swelling  . Ranitidine     Developed bad heartburn and stomach pain  . Sulfa Antibiotics   . Benadryl [Diphenhydramine]     Feels "uneasy"  . Pce  [Erythromycin] Rash     Current Outpatient Medications:  .  hydrochlorothiazide (HYDRODIURIL) 12.5 MG tablet, TAKE 1/2 TABLET EVERY DAY (Patient taking differently: Take 6.25 mg by mouth daily. ), Disp: 45 tablet, Rfl: 12 .  latanoprost (XALATAN) 0.005 % ophthalmic solution, Place 1 drop into both eyes at bedtime., Disp: , Rfl:  .  levothyroxine (SYNTHROID) 50 MCG tablet, TAKE 1 TABLET (50 MCG TOTAL) BY MOUTH DAILY., Disp: 90 tablet, Rfl: 3 .  simvastatin (ZOCOR) 10 MG tablet, Take 1 tablet (10 mg total) by mouth every evening., Disp: 90 tablet, Rfl: 3 .  timolol (TIMOPTIC) 0.5 % ophthalmic solution, Place 1 drop into both eyes 2 (two) times daily., Disp: 10 mL, Rfl: 11 .  predniSONE (DELTASONE) 50 MG tablet, Take 1 tablet (50 mg total) by mouth daily with breakfast. (Patient not taking: Reported on 10/10/2018), Disp: 5 tablet, Rfl: 0  Review of Systems  Constitutional: Negative for appetite change, chills and fever.  HENT: Negative.    Eyes: Negative.   Respiratory: Negative.  Negative for chest tightness, shortness of breath and wheezing.   Cardiovascular: Negative for chest pain and palpitations.  Gastrointestinal: Negative for abdominal pain, nausea and vomiting.  Endocrine: Negative.   Genitourinary: Positive for dysuria.  Allergic/Immunologic: Negative.   Hematological: Negative.   Psychiatric/Behavioral: Negative.     Social History   Tobacco Use  . Smoking status: Never Smoker  . Smokeless tobacco: Never Used  Substance Use Topics  . Alcohol use: Not Currently      Objective:   BP 140/84   Pulse 78   Temp (!) 97.1 F (36.2 C) (Oral)   Resp 16   Wt 166 lb 12.8 oz (75.7 kg)   SpO2 98%   BMI 22.62 kg/m  Vitals:   11/21/18 0818  BP: 140/84  Pulse: 78  Resp: 16  Temp: (!) 97.1 F (36.2 C)  TempSrc: Oral  SpO2: 98%  Weight: 166 lb 12.8 oz (75.7 kg)  Body mass index is 22.62 kg/m.   Physical Exam Vitals signs reviewed.  Constitutional:      Appearance: He is well-developed.  HENT:     Head: Normocephalic and atraumatic.     Right Ear: External ear normal.     Left Ear: External ear normal.     Nose: Nose normal.  Eyes:     Conjunctiva/sclera: Conjunctivae normal.  Pupils: Pupils are equal, round, and reactive to light.  Neck:     Musculoskeletal: Normal range of motion and neck supple.  Cardiovascular:     Rate and Rhythm: Normal rate and regular rhythm.     Heart sounds: Normal heart sounds.  Pulmonary:     Effort: Pulmonary effort is normal.     Breath sounds: Normal breath sounds.  Abdominal:     General: Bowel sounds are normal.     Palpations: Abdomen is soft.  Genitourinary:    Penis: Normal.      Comments: 1 cm nodule in right groin that is nontender,mobile,.  Musculoskeletal: Normal range of motion.  Skin:    General: Skin is warm and dry.  Neurological:     Mental Status: He is alert and oriented to person, place, and time.     Comments: He has a very mild  head tremor and bilateral hand tremor.  This appears to be more of an essential tremor than anything else.  Psychiatric:        Mood and Affect: Mood normal.        Behavior: Behavior normal.        Thought Content: Thought content normal.        Judgment: Judgment normal.      No results found for any visits on 11/21/18.     Assessment & Plan       1. Dysuria This is improving. - POCT urinalysis dipstick  2. History of prostate cancer Patient insist on PSA.  At this point is normal would not order any more at 85 years old.  There is a benign-appearing 1 cm nodule in the right groin.  Most likely a sebaceous cyst versus lipoma.  I do not think this is an abnormal lymph node. - PSA  3. Benign head tremor   4. Essential (primary) hypertension Controlled. 5.Weight loss Pt has gained some weight back  Wilhemena Durie, MD  [facility]

## 2018-11-21 ENCOUNTER — Encounter: Payer: Self-pay | Admitting: Family Medicine

## 2018-11-21 ENCOUNTER — Other Ambulatory Visit: Payer: Self-pay

## 2018-11-21 ENCOUNTER — Ambulatory Visit (INDEPENDENT_AMBULATORY_CARE_PROVIDER_SITE_OTHER): Payer: Medicare HMO | Admitting: Family Medicine

## 2018-11-21 VITALS — BP 140/84 | HR 78 | Temp 97.1°F | Resp 16 | Wt 166.8 lb

## 2018-11-21 DIAGNOSIS — Z8546 Personal history of malignant neoplasm of prostate: Secondary | ICD-10-CM | POA: Diagnosis not present

## 2018-11-21 DIAGNOSIS — I10 Essential (primary) hypertension: Secondary | ICD-10-CM | POA: Diagnosis not present

## 2018-11-21 DIAGNOSIS — G250 Essential tremor: Secondary | ICD-10-CM | POA: Diagnosis not present

## 2018-11-21 DIAGNOSIS — R300 Dysuria: Secondary | ICD-10-CM

## 2018-11-21 LAB — POCT URINALYSIS DIPSTICK
Bilirubin, UA: NEGATIVE
Blood, UA: NEGATIVE
Glucose, UA: NEGATIVE
Leukocytes, UA: NEGATIVE
Nitrite, UA: NEGATIVE
Protein, UA: POSITIVE — AB
Spec Grav, UA: 1.025 (ref 1.010–1.025)
Urobilinogen, UA: 0.2 E.U./dL
pH, UA: 5 (ref 5.0–8.0)

## 2018-11-22 LAB — PSA: Prostate Specific Ag, Serum: 0.2 ng/mL (ref 0.0–4.0)

## 2018-11-23 ENCOUNTER — Telehealth: Payer: Self-pay

## 2018-11-23 NOTE — Telephone Encounter (Signed)
Patients wife had called stating that patient would like to pick up copy of his PSA results today and request that we leave at front desk.KW

## 2018-11-23 NOTE — Telephone Encounter (Signed)
Printed and left up front for pick up

## 2018-12-05 ENCOUNTER — Telehealth: Payer: Self-pay

## 2018-12-05 DIAGNOSIS — R300 Dysuria: Secondary | ICD-10-CM

## 2018-12-05 NOTE — Telephone Encounter (Signed)
Is it okay to refer pt to Urologist?  Thanks,   -Mickel Baas

## 2018-12-05 NOTE — Telephone Encounter (Signed)
Copied from [place] [id number]. Topic: Referral - Request for Referral >> Dec 05, 2018 11:02 AM Scherrie Gerlach wrote: Pt has seen Dr Rosanna Randy 2 times last month for UA issues.  Would like to proceed with referral to an urologist. And pt states just go ahead and make appt for hhim any time is ok.

## 2018-12-06 NOTE — Telephone Encounter (Signed)
Referral has been placed. 

## 2018-12-06 NOTE — Telephone Encounter (Signed)
ok 

## 2018-12-22 NOTE — Progress Notes (Signed)
Subjective:   Jeffrey Laba. is a 85 y.o. male who presents for Medicare Annual/Subsequent preventive examination.  Review of Systems:  N/A  Cardiac Risk Factors include: advanced age (>57men, >59 women);male gender;dyslipidemia     Objective:    Vitals: BP (!) 156/76 (BP Location: Right Arm)   Pulse 82   Temp 97.6 F (36.4 C) (Oral)   Ht 6' (1.829 m)   Wt 167 lb 6.4 oz (75.9 kg)   BMI 22.70 kg/m   Body mass index is 22.7 kg/m.  Advanced Directives 12/26/2018 09/14/2018 12/22/2017 11/11/2017 02/13/2016 12/31/2015 12/03/2014  Does Patient Have a Medical Advance Directive? No No No No No Yes;No No  Would patient like information on creating a medical advance directive? No - Patient declined - No - Patient declined No - Patient declined - - -    Tobacco Social History   Tobacco Use  Smoking Status Never Smoker  Smokeless Tobacco Never Used     Counseling given: Not Answered   Clinical Intake:  Pre-visit preparation completed: Yes  Pain : No/denies pain Pain Score: 0-No pain     Nutritional Status: BMI of 19-24  Normal Nutritional Risks: None Diabetes: No  How often do you need to have someone help you when you read instructions, pamphlets, or other written materials from your doctor or pharmacy?: 1 - Never  Interpreter Needed?: No  Information entered by :: [facility], LPN  Past Medical History:  Diagnosis Date  . Cancer ([place])    PROSTATE  . Glaucoma   . Hyperlipidemia   . Hypertension    Past Surgical History:  Procedure Laterality Date  . CATARACT EXTRACTION  2012, 2013  . COLON RESECTION  2009  . COLONOSCOPY  09-14-08,09/2013.   Dr Jamal Collin  . COLONOSCOPY WITH PROPOFOL N/A 10/31/2014   Procedure: COLONOSCOPY WITH PROPOFOL;  Surgeon: Christene Lye, MD;  Location: [facility] ENDOSCOPY;  Service: Endoscopy;  Laterality: N/A;  . PROSTATE SURGERY  1996   Family History  Problem Relation Age of Onset  . Stroke Mother   . Colon cancer Father    . Colon cancer Sister   . Lung cancer Sister   . Lung cancer Brother   . Diabetes Sister   . Heart disease Sister   . Heart disease Sister    Social History   Socioeconomic History  . Marital status: Married    Spouse name: Not on file  . Number of children: 2  . Years of education: Not on file  . Highest education level: Bachelor's degree (e.g., BA, AB, BS)  Occupational History  . Occupation: retired  Scientific laboratory technician  . Financial resource strain: Not hard at all  . Food insecurity    Worry: Never true    Inability: Never true  . Transportation needs    Medical: No    Non-medical: No  Tobacco Use  . Smoking status: Never Smoker  . Smokeless tobacco: Never Used  Substance and Sexual Activity  . Alcohol use: Not Currently  . Drug use: No  . Sexual activity: Not on file  Lifestyle  . Physical activity    Days per week: 0 days    Minutes per session: 0 min  . Stress: Not at all  Relationships  . Social Herbalist on phone: Patient refused    Gets together: Patient refused    Attends religious service: Patient refused    Active member of club or organization: Patient refused  Attends meetings of clubs or organizations: Patient refused    Relationship status: Patient refused  Other Topics Concern  . Not on file  Social History Narrative  . Not on file    Outpatient Encounter Medications as of 12/26/2018  Medication Sig  . hydrochlorothiazide (HYDRODIURIL) 12.5 MG tablet TAKE 1/2 TABLET EVERY DAY  . latanoprost (XALATAN) 0.005 % ophthalmic solution Place 1 drop into both eyes at bedtime.  Marland Kitchen levothyroxine (SYNTHROID) 50 MCG tablet TAKE 1 TABLET (50 MCG TOTAL) BY MOUTH DAILY.  . simvastatin (ZOCOR) 10 MG tablet Take 1 tablet (10 mg total) by mouth every evening.  . timolol (TIMOPTIC) 0.5 % ophthalmic solution Place 1 drop into both eyes 2 (two) times daily.  . predniSONE (DELTASONE) 50 MG tablet Take 1 tablet (50 mg total) by mouth daily with breakfast.  (Patient not taking: Reported on 10/10/2018)  . [DISCONTINUED] hydrochlorothiazide (HYDRODIURIL) 12.5 MG tablet TAKE 1/2 TABLET EVERY DAY (Patient taking differently: Take 6.25 mg by mouth daily. )   No facility-administered encounter medications on file as of 12/26/2018.     Activities of Daily Living In your present state of health, do you have any difficulty performing the following activities: 12/26/2018  Hearing? N  Vision? Y  Comment Due to glaucoma in both eyes.  Difficulty concentrating or making decisions? N  Walking or climbing stairs? N  Dressing or bathing? N  Doing errands, shopping? N  Preparing Food and eating ? N  Using the Toilet? N  In the past six months, have you accidently leaked urine? N  Do you have problems with loss of bowel control? N  Managing your Medications? N  Managing your Finances? N  Housekeeping or managing your Housekeeping? N  Some recent data might be hidden    Patient Care Team: Jerrol Banana., MD as PCP - General (Family Medicine) Leandrew Koyanagi, MD as Referring Physician (Ophthalmology) Dasher, Rayvon Char, MD (Dermatology)   Assessment:   This is a routine wellness examination for Jeffrey Hendrix.  Exercise Activities and Dietary recommendations Current Exercise Habits: The patient does not participate in regular exercise at present, Exercise limited by: Other - see comments(tremors)  Goals    . DIET - INCREASE WATER INTAKE     Recommend to drink at least 6-8 8oz glasses of water per day.       Fall Risk: Fall Risk  12/26/2018 12/22/2017 07/08/2017 12/31/2015 12/31/2014  Falls in the past year? 0 0 No No No  Number falls in past yr: 0 - - - -  Injury with Fall? 0 - - - -    FALL RISK PREVENTION PERTAINING TO THE HOME:  Any stairs in or around the home? Yes  If so, are there any without handrails? No   Home free of loose throw rugs in walkways, pet beds, electrical cords, etc? Yes  Adequate lighting in your home to reduce risk  of falls? Yes   ASSISTIVE DEVICES UTILIZED TO PREVENT FALLS:  Life alert? No  Use of a cane, walker or w/c? No  Grab bars in the bathroom? No  Shower chair or bench in shower? Yes  Elevated toilet seat or a handicapped toilet? No   TIMED UP AND GO:  Was the test performed? No .    Depression Screen PHQ 2/9 Scores 12/26/2018 12/22/2017 07/08/2017 12/31/2015  PHQ - 2 Score 0 0 0 0    Cognitive Function: Declined today.         Immunization History  Administered  Date(s) Administered  . Fluad Quad(high Dose 65+) 10/10/2018  . Influenza, High Dose Seasonal PF 11/26/2015, 10/28/2016  . Influenza-Unspecified 11/23/2017  . Pneumococcal Polysaccharide-23 01/25/1992  . Td 01/22/2010  . Tdap 08/02/2012  . Zoster 01/16/2013    Qualifies for Shingles Vaccine? Yes  Zostavax completed 01/16/13. Due for Shingrix. Education has been provided regarding t/he importance of this vaccine. Pt has been advised to call insurance company to determine out of pocket expense. Advised may also receive vaccine at local pharmacy or Health Dept. Verbalized acceptance and understanding.  Tdap: Up to date  Flu Vaccine: Up to date  Pneumococcal Vaccine: Due for Pneumococcal vaccine. Does the patient want to receive this vaccine today?  No . Pt to check with [facility] in inquire about receiving the Prevnar 13 vaccine.   Screening Tests Health Maintenance  Topic Date Due  . PNA vac Low Risk Adult (2 of 2 - PCV13) 01/24/1993  . TETANUS/TDAP  08/03/2022  . INFLUENZA VACCINE  Completed   Cancer Screenings:  Colorectal Screening: No longer required.   Lung Cancer Screening: (Low Dose CT Chest recommended if Age 42-80 years, 30 pack-year currently smoking OR have quit w/in 15years.) does not qualify.   Additional Screening:  Vision Screening: Recommended annual ophthalmology exams for early detection of glaucoma and other disorders of the eye.  Dental Screening: Recommended annual dental exams for proper  oral hygiene  Community Resource Referral:  CRR required this visit?  No        Plan:  I have personally reviewed and addressed the Medicare Annual Wellness questionnaire and have noted the following in the patient's chart:  A. Medical and social history B. Use of alcohol, tobacco or illicit drugs  C. Current medications and supplements D. Functional ability and status E.  Nutritional status F.  Physical activity G. Advance directives H. List of other physicians I.  Hospitalizations, surgeries, and ER visits in previous 12 months J.  [place] such as hearing and vision if needed, cognitive and depression L. Referrals and appointments   In addition, I have reviewed and discussed with patient certain preventive protocols, quality metrics, and best practice recommendations. A written personalized care plan for preventive services as well as general preventive health recommendations were provided to patient.   Glendora Score, [place]  [id number] Nurse Health Advisor   Nurse Notes: Pt to check with [facility] in inquire about receiving the Prevnar 13 vaccine.

## 2018-12-23 ENCOUNTER — Other Ambulatory Visit: Payer: Self-pay | Admitting: Family Medicine

## 2018-12-23 NOTE — Progress Notes (Signed)
Patient: Jeffrey Hendrix., Male    DOB: 1924-05-08, 85 y.o.   MRN: [id number] Visit Date: 12/26/2018  Today's Provider: Wilhemena Durie, MD   Chief Complaint  Patient presents with  . Annual Exam   Subjective:     Patient had AWV with [facility] today at 10:00 am.   Complete Physical Jeffrey Hendrix. is a 85 y.o. male. He feels well. He reports exercising none. He reports he is sleeping fairly well.  -------------------------------------------------------  Colonoscopy: 10/31/2014  Review of Systems  Constitutional: Negative.   HENT: Negative.   Eyes: Positive for photophobia.  Respiratory: Negative.   Cardiovascular: Negative.   Gastrointestinal: Negative.   Endocrine: Negative.   Allergic/Immunologic: Negative.   Neurological: Positive for tremors.       He is right-handed.  He has had hand tremors, right greater than left, for years.  Slowly getting worse.  He wishes to try treatment for this.  He heard there was a microwave treatment that would cure it.  Psychiatric/Behavioral: Negative.     Social History   Socioeconomic History  . Marital status: Married    Spouse name: Not on file  . Number of children: 2  . Years of education: Not on file  . Highest education level: Bachelor's degree (e.g., BA, AB, BS)  Occupational History  . Occupation: retired  Scientific laboratory technician  . Financial resource strain: Not hard at all  . Food insecurity    Worry: Never true    Inability: Never true  . Transportation needs    Medical: No    Non-medical: No  Tobacco Use  . Smoking status: Never Smoker  . Smokeless tobacco: Never Used  Substance and Sexual Activity  . Alcohol use: Not Currently  . Drug use: No  . Sexual activity: Not on file  Lifestyle  . Physical activity    Days per week: 0 days    Minutes per session: 0 min  . Stress: Not at all  Relationships  . Social Herbalist on phone: Patient refused    Gets together: Patient refused     Attends religious service: Patient refused    Active member of club or organization: Patient refused    Attends meetings of clubs or organizations: Patient refused    Relationship status: Patient refused  . Intimate partner violence    Fear of current or ex partner: Patient refused    Emotionally abused: Patient refused    Physically abused: Patient refused    Forced sexual activity: Patient refused  Other Topics Concern  . Not on file  Social History Narrative  . Not on file    Past Medical History:  Diagnosis Date  . Cancer ([place])    PROSTATE  . Glaucoma   . Hyperlipidemia   . Hypertension      Patient Active Problem List   Diagnosis Date Noted  . URI (upper respiratory infection) 06/19/2017  . Benign fibroma of prostate 11/28/2014  . Colon polyp 11/28/2014  . Essential (primary) hypertension 11/28/2014  . Bergmann's syndrome 11/28/2014  . HLD (hyperlipidemia) 11/28/2014  . Adult hypothyroidism 11/28/2014  . Esophagitis, reflux 11/28/2014  . Has a tremor 11/28/2014  . Glaucoma 04/19/2014  . Personal history of colonic polyps 09/28/2013  . History of colon polyps 09/28/2013    Past Surgical History:  Procedure Laterality Date  . CATARACT EXTRACTION  2012, 2013  . COLON RESECTION  2009  . COLONOSCOPY  09-14-08,09/2013.   Dr Jamal Collin  . COLONOSCOPY WITH PROPOFOL N/A 10/31/2014   Procedure: COLONOSCOPY WITH PROPOFOL;  Surgeon: Christene Lye, MD;  Location: [facility] ENDOSCOPY;  Service: Endoscopy;  Laterality: N/A;  . [place]    His family history includes Colon cancer in his father and sister; Diabetes in his sister; Heart disease in his sister and sister; Lung cancer in his brother and sister; Stroke in his mother.   Current Outpatient Medications:  .  hydrochlorothiazide (HYDRODIURIL) 12.5 MG tablet, TAKE 1/2 TABLET EVERY DAY, Disp: 45 tablet, Rfl: 2 .  latanoprost (XALATAN) 0.005 % ophthalmic solution, Place 1 drop into both eyes at bedtime.,  Disp: , Rfl:  .  levothyroxine (SYNTHROID) 50 MCG tablet, TAKE 1 TABLET (50 MCG TOTAL) BY MOUTH DAILY., Disp: 90 tablet, Rfl: 3 .  predniSONE (DELTASONE) 50 MG tablet, Take 1 tablet (50 mg total) by mouth daily with breakfast. (Patient not taking: Reported on 10/10/2018), Disp: 5 tablet, Rfl: 0 .  simvastatin (ZOCOR) 10 MG tablet, Take 1 tablet (10 mg total) by mouth every evening., Disp: 90 tablet, Rfl: 3 .  timolol (TIMOPTIC) 0.5 % ophthalmic solution, Place 1 drop into both eyes 2 (two) times daily., Disp: 10 mL, Rfl: 11  Patient Care Team: Jerrol Banana., MD as PCP - General (Family Medicine) Leandrew Koyanagi, MD as Referring Physician (Ophthalmology) Dasher, Rayvon Char, MD (Dermatology)     Objective:    Vitals:   BP 156/76 (BP Location: Right Arm)  Pulse 82  Temp 97.6 F (36.4 C) (Oral)  Ht 6' (1.829 m)  Wt 167 lb 6.4 oz (75.9 kg)  BMI 22.70 kg/m  BSA 1.96 m   Physical Exam Vitals signs reviewed.  Constitutional:      Appearance: He is well-developed.     Comments: 85 year old gentleman appears much younger than his age.  HENT:     Head: Normocephalic and atraumatic.     Right Ear: External ear normal.     Left Ear: External ear normal.     Nose: Nose normal.  Eyes:     Conjunctiva/sclera: Conjunctivae normal.     Pupils: Pupils are equal, round, and reactive to light.  Neck:     Musculoskeletal: Normal range of motion and neck supple.  Cardiovascular:     Rate and Rhythm: Normal rate and regular rhythm.     Heart sounds: Normal heart sounds.  Pulmonary:     Effort: Pulmonary effort is normal.     Breath sounds: Normal breath sounds.  Abdominal:     General: Bowel sounds are normal.     Palpations: Abdomen is soft.  Genitourinary:    Penis: Normal.      Prostate: Normal.     Rectum: Normal.  Musculoskeletal: Normal range of motion.  Skin:    General: Skin is warm and dry.  Neurological:     Mental Status: He is alert and oriented to person,  place, and time.     Comments: He has a very mild head tremor and bilateral hand tremor.  This appears to be more of an essential tremor than anything else.  Psychiatric:        Behavior: Behavior normal.        Thought Content: Thought content normal.        Judgment: Judgment normal.     Activities of Daily Living In your present state of health, do you have any difficulty performing the following activities: 12/26/2018  Hearing? N  Vision? Y  Comment Due to glaucoma in both eyes.  Difficulty concentrating or making decisions? N  Walking or climbing stairs? N  Dressing or bathing? N  Doing errands, shopping? N  Preparing Food and eating ? N  Using the Toilet? N  In the past six months, have you accidently leaked urine? N  Do you have problems with loss of bowel control? N  Managing your Medications? N  Managing your Finances? N  Housekeeping or managing your Housekeeping? N  Some recent data might be hidden    Fall Risk Assessment Fall Risk  12/26/2018 12/22/2017 07/08/2017 12/31/2015 12/31/2014  Falls in the past year? 0 0 No No No  Number falls in past yr: 0 - - - -  Injury with Fall? 0 - - - -     Depression Screen PHQ 2/9 Scores 12/26/2018 12/22/2017 07/08/2017 12/31/2015  PHQ - 2 Score 0 0 0 0    No flowsheet data found.     Assessment & Plan:    Annual Physical Reviewed patient's Family Medical History Reviewed and updated list of patient's medical providers Assessment of cognitive impairment was done Assessed patient's functional ability Established a written schedule for health screening [place] Completed and Reviewed  Exercise Activities and Dietary recommendations Goals    . DIET - INCREASE WATER INTAKE     Recommend to drink at least 6-8 8oz glasses of water per day.       Immunization History  Administered Date(s) Administered  . Fluad Quad(high Dose 65+) 10/10/2018  . Influenza, High Dose Seasonal PF 11/26/2015,  10/28/2016  . Influenza-Unspecified 11/23/2017  . Pneumococcal Polysaccharide-23 01/25/1992  . Td 01/22/2010  . Tdap 08/02/2012  . Zoster 01/16/2013    Health Maintenance  Topic Date Due  . PNA vac Low Risk Adult (2 of 2 - PCV13) 01/24/1993  . TETANUS/TDAP  08/03/2022  . INFLUENZA VACCINE  Completed     Discussed health benefits of physical activity, and encouraged him to engage in regular exercise appropriate for his age and condition.    -----------------------------------------------------------------   1. Annual physical exam Overall patient doing very well.  2. Has a tremor Patient wishes treatment so we will try low-dose propanolol.  Return to clinic 1 to 2 months - propranolol (INDERAL) 10 MG tablet; Take 1 tablet (10 mg total) by mouth 2 (two) times daily.  Dispense: 60 tablet; Refill: 2  3. Colon cancer screening Patient insists he needs colon cancer screening due to history of carcinoma in situ of the rectum in 2015 and due to partial colectomy of the right colon due to adenomatous polyps.  Will order Cologuard against my better judgment in this 85 year old - Cologuard  4. Essential (primary) hypertension Controlled  5. Adenomatous polyp of transverse colon   6. Carcinoma in situ in rectal polyp      I,Bayler Nehring,acting as a scribe for Wilhemena Durie, MD.,have documented all relevant documentation on the behalf of Wilhemena Durie, MD,as directed by  Wilhemena Durie, MD while in the presence of Wilhemena Durie, MD.     Wilhemena Durie, MD  [facility]

## 2018-12-26 ENCOUNTER — Ambulatory Visit (INDEPENDENT_AMBULATORY_CARE_PROVIDER_SITE_OTHER): Payer: Medicare HMO

## 2018-12-26 ENCOUNTER — Other Ambulatory Visit: Payer: Self-pay

## 2018-12-26 ENCOUNTER — Ambulatory Visit (INDEPENDENT_AMBULATORY_CARE_PROVIDER_SITE_OTHER): Payer: Medicare HMO | Admitting: Family Medicine

## 2018-12-26 VITALS — BP 156/76 | HR 82 | Temp 97.6°F | Ht 72.0 in | Wt 167.4 lb

## 2018-12-26 DIAGNOSIS — D012 Carcinoma in situ of rectum: Secondary | ICD-10-CM | POA: Diagnosis not present

## 2018-12-26 DIAGNOSIS — D123 Benign neoplasm of transverse colon: Secondary | ICD-10-CM | POA: Diagnosis not present

## 2018-12-26 DIAGNOSIS — Z1211 Encounter for screening for malignant neoplasm of colon: Secondary | ICD-10-CM

## 2018-12-26 DIAGNOSIS — R251 Tremor, unspecified: Secondary | ICD-10-CM

## 2018-12-26 DIAGNOSIS — Z0000 Encounter for general adult medical examination without abnormal findings: Secondary | ICD-10-CM | POA: Diagnosis not present

## 2018-12-26 DIAGNOSIS — I10 Essential (primary) hypertension: Secondary | ICD-10-CM | POA: Diagnosis not present

## 2018-12-26 MED ORDER — PROPRANOLOL HCL 10 MG PO TABS: 10.0000 mg | ORAL_TABLET | Freq: Two times a day (BID) | ORAL | 2 refills | Status: DC

## 2018-12-26 NOTE — Patient Instructions (Signed)
Jeffrey Hendrix , Thank you for taking time to come for your Medicare Wellness Visit. I appreciate your ongoing commitment to your health goals. Please review the following plan we discussed and let me know if I can assist you in the future.   Screening recommendations/referrals: Colonoscopy: No longer required.  Recommended yearly ophthalmology/optometry visit for glaucoma screening and checkup Recommended yearly dental visit for hygiene and checkup  Vaccinations: Influenza vaccine: Up to date Pneumococcal vaccine: Prevnar 13 due per records. Pt to check with pharmacy to see if received there and then will let clinic know.  Tdap vaccine: Up to date, due 07/2022 Shingles vaccine: Pt declines today.     Advanced directives: Advance directive discussed with you today. Even though you declined this today please call our office should you change your mind and we can give you the proper paperwork for you to fill out.  Conditions/risks identified: Recommend to increase water intake to 6-8 8 oz glasses a day.   Next appointment: 10:40 AM today with Dr Rosanna Randy.   Preventive Care 3 Years and Older, Male Preventive care refers to lifestyle choices and visits with your health care provider that can promote health and wellness. What does preventive care include?  A yearly physical exam. This is also called an annual well check.  Dental exams once or twice a year.  Routine eye exams. Ask your health care provider how often you should have your eyes checked.  Personal lifestyle choices, including:  Daily care of your teeth and gums.  Regular physical activity.  Eating a healthy diet.  Avoiding tobacco and drug use.  Limiting alcohol use.  Practicing safe sex.  Taking low doses of aspirin every day.  Taking vitamin and mineral supplements as recommended by your health care provider. What happens during an annual well check? The services and screenings done by your health care provider  during your annual well check will depend on your age, overall health, lifestyle risk factors, and family history of disease. Counseling  Your health care provider may ask you questions about your:  Alcohol use.  Tobacco use.  Drug use.  Emotional well-being.  Home and relationship well-being.  Sexual activity.  Eating habits.  History of falls.  Memory and ability to understand (cognition).  Work and work Statistician. Screening  You may have the following tests or measurements:  Height, weight, and BMI.  Blood pressure.  Lipid and cholesterol levels. These may be checked every 5 years, or more frequently if you are over 51 years old.  Skin check.  Lung cancer screening. You may have this screening every year starting at age 69 if you have a 30-pack-year history of smoking and currently smoke or have quit within the past 15 years.  Fecal occult blood test (FOBT) of the stool. You may have this test every year starting at age 28.  Flexible sigmoidoscopy or colonoscopy. You may have a sigmoidoscopy every 5 years or a colonoscopy every 10 years starting at age 81.  Prostate cancer screening. Recommendations will vary depending on your family history and other risks.  Hepatitis C blood test.  Hepatitis B blood test.  Sexually transmitted disease (STD) testing.  Diabetes screening. This is done by checking your blood sugar (glucose) after you have not eaten for a while (fasting). You may have this done every 1-3 years.  Abdominal aortic aneurysm (AAA) screening. You may need this if you are a current or former smoker.  Osteoporosis. You may be screened starting at  age 48 if you are at high risk. Talk with your health care provider about your test results, treatment options, and if necessary, the need for more tests. Vaccines  Your health care provider may recommend certain vaccines, such as:  Influenza vaccine. This is recommended every year.  Tetanus,  diphtheria, and acellular pertussis (Tdap, Td) vaccine. You may need a Td booster every 10 years.  Zoster vaccine. You may need this after age 68.  Pneumococcal 13-valent conjugate (PCV13) vaccine. One dose is recommended after age 21.  Pneumococcal polysaccharide (PPSV23) vaccine. One dose is recommended after age 51. Talk to your health care provider about which screenings and vaccines you need and how often you need them. This information is not intended to replace advice given to you by your health care provider. Make sure you discuss any questions you have with your health care provider. Document Released: 02/01/2015 Document Revised: 09/25/2015 Document Reviewed: 11/06/2014 Elsevier Interactive Patient Education  2017 [place] Prevention in the Home Falls can cause injuries. They can happen to people of all ages. There are many things you can do to make your home safe and to help prevent falls. What can I do on the outside of my home?  Regularly fix the edges of walkways and driveways and fix any cracks.  Remove anything that might make you trip as you walk through a door, such as a raised step or threshold.  Trim any bushes or trees on the path to your home.  Use bright outdoor lighting.  Clear any walking paths of anything that might make someone trip, such as rocks or tools.  Regularly check to see if handrails are loose or broken. Make sure that both sides of any steps have handrails.  Any raised decks and porches should have guardrails on the edges.  Have any leaves, snow, or ice cleared regularly.  Use sand or salt on walking paths during winter.  Clean up any spills in your garage right away. This includes oil or grease spills. What can I do in the bathroom?  Use night lights.  Install grab bars by the toilet and in the tub and shower. Do not use towel bars as grab bars.  Use non-skid mats or decals in the tub or shower.  If you need to sit down in  the shower, use a plastic, non-slip stool.  Keep the floor dry. Clean up any water that spills on the floor as soon as it happens.  Remove soap buildup in the tub or shower regularly.  Attach bath mats securely with double-sided non-slip rug tape.  Do not have throw rugs and other things on the floor that can make you trip. What can I do in the bedroom?  Use night lights.  Make sure that you have a light by your bed that is easy to reach.  Do not use any sheets or blankets that are too big for your bed. They should not hang down onto the floor.  Have a firm chair that has side arms. You can use this for support while you get dressed.  Do not have throw rugs and other things on the floor that can make you trip. What can I do in the kitchen?  Clean up any spills right away.  Avoid walking on wet floors.  Keep items that you use a lot in easy-to-reach places.  If you need to reach something above you, use a strong step stool that has a grab bar.  Keep electrical cords out of the way.  Do not use floor polish or wax that makes floors slippery. If you must use wax, use non-skid floor wax.  Do not have throw rugs and other things on the floor that can make you trip. What can I do with my stairs?  Do not leave any items on the stairs.  Make sure that there are handrails on both sides of the stairs and use them. Fix handrails that are broken or loose. Make sure that handrails are as long as the stairways.  Check any carpeting to make sure that it is firmly attached to the stairs. Fix any carpet that is loose or worn.  Avoid having throw rugs at the top or bottom of the stairs. If you do have throw rugs, attach them to the floor with carpet tape.  Make sure that you have a light switch at the top of the stairs and the bottom of the stairs. If you do not have them, ask someone to add them for you. What else can I do to help prevent falls?  Wear shoes that:  Do not have high  heels.  Have rubber bottoms.  Are comfortable and fit you well.  Are closed at the toe. Do not wear sandals.  If you use a stepladder:  Make sure that it is fully opened. Do not climb a closed stepladder.  Make sure that both sides of the stepladder are locked into place.  Ask someone to hold it for you, if possible.  Clearly mark and make sure that you can see:  Any grab bars or handrails.  First and last steps.  Where the edge of each step is.  Use tools that help you move around (mobility aids) if they are needed. These include:  Canes.  Walkers.  Scooters.  Crutches.  Turn on the lights when you go into a dark area. Replace any light bulbs as soon as they burn out.  Set up your furniture so you have a clear path. Avoid moving your furniture around.  If any of your floors are uneven, fix them.  If there are any pets around you, be aware of where they are.  Review your medicines with your doctor. Some medicines can make you feel dizzy. This can increase your chance of falling. Ask your doctor what other things that you can do to help prevent falls. This information is not intended to replace advice given to you by your health care provider. Make sure you discuss any questions you have with your health care provider. Document Released: 11/01/2008 Document Revised: 06/13/2015 Document Reviewed: 02/09/2014 Elsevier Interactive Patient Education  2017 [employer].

## 2019-01-05 ENCOUNTER — Encounter: Payer: Self-pay | Admitting: Urology

## 2019-01-05 ENCOUNTER — Ambulatory Visit: Payer: Medicare HMO | Admitting: Urology

## 2019-01-05 ENCOUNTER — Other Ambulatory Visit: Payer: Self-pay

## 2019-01-05 VITALS — BP 142/80 | HR 60 | Ht 72.0 in | Wt 166.0 lb

## 2019-01-05 DIAGNOSIS — R102 Pelvic and perineal pain: Secondary | ICD-10-CM | POA: Diagnosis not present

## 2019-01-05 DIAGNOSIS — R300 Dysuria: Secondary | ICD-10-CM | POA: Diagnosis not present

## 2019-01-05 LAB — URINALYSIS, COMPLETE
Bilirubin, UA: NEGATIVE
Glucose, UA: NEGATIVE
Ketones, UA: NEGATIVE
Leukocytes,UA: NEGATIVE
Nitrite, UA: NEGATIVE
Protein,UA: NEGATIVE
RBC, UA: NEGATIVE
Specific Gravity, UA: >1.03 — ABNORMAL HIGH (ref 1.005–1.030)
Urobilinogen, Ur: 0.2 mg/dL (ref 0.2–1.0)
pH, UA: 5.5 (ref 5.0–7.5)

## 2019-01-05 LAB — MICROSCOPIC EXAMINATION
Bacteria, UA: NONE SEEN
RBC, Urine: NONE SEEN /hpf (ref 0–2)
WBC, UA: NONE SEEN /hpf (ref 0–5)

## 2019-01-06 ENCOUNTER — Encounter: Payer: Self-pay | Admitting: Urology

## 2019-01-06 NOTE — Progress Notes (Signed)
01/05/2019 7:59 AM   Jeffrey Hendrix. 1924-05-09 [id number]  Referring provider: Jerrol Banana., MD [address] [place],  [place] [address]  Chief Complaint  Patient presents with  . Dysuria    HPI: Jeffrey Hendrix is a 85 y.o. male who presents with a 16-month history of a burning discomfort prior to voiding and immediately after voiding.  He denies pain with urination.  He describes the severity as mild.  It typically will last 5-10 minutes after voiding but on occasion has recently lasted up to 30 minutes and he has difficulty falling back asleep.  His symptoms are worse at night.  He has a history of prostate cancer status post perineal prostatectomy at [facility] several years ago.  He recently requested a PSA with Dr. Rosanna Randy which was 0.2.  He saw Dr. Jacqlyn Larsen in 2015 and had also noted a 52-month history of discomfort after voiding.  PSA at that time was 0.2.  Denies gross hematuria.  Denies flank or abdominal pain.  He had seen Dr. Jacqlyn Larsen who felt he had a right inguinal hernia however he saw Dr. Acquanetta Belling who did not think this was a hernia.   PMH: Past Medical History:  Diagnosis Date  . Cancer ([place])    PROSTATE  . Glaucoma   . Hyperlipidemia   . Hypertension     Surgical History: Past Surgical History:  Procedure Laterality Date  . CATARACT EXTRACTION  2012, 2013  . COLON RESECTION  2009  . COLONOSCOPY  09-14-08,09/2013.   Dr Jamal Collin  . COLONOSCOPY WITH PROPOFOL N/A 10/31/2014   Procedure: COLONOSCOPY WITH PROPOFOL;  Surgeon: Christene Lye, MD;  Location: [facility] ENDOSCOPY;  Service: Endoscopy;  Laterality: N/A;  . PROSTATE SURGERY  1996    Home Medications:  Allergies as of 01/05/2019      Reactions   Doxycycline    Latex    Omeprazole Swelling   Ranitidine    Developed bad heartburn and stomach pain   Sulfa Antibiotics    Benadryl [diphenhydramine]    Feels "uneasy"   Pce  [erythromycin] Rash      Medication List       Accurate as of January 05, 2019 11:59 PM. If you have any questions, ask your nurse or doctor.        STOP taking these medications   predniSONE 50 MG tablet Commonly known as: DELTASONE Stopped by: Abbie Sons, MD     TAKE these medications   hydrochlorothiazide 12.5 MG tablet Commonly known as: HYDRODIURIL TAKE 1/2 TABLET EVERY DAY   latanoprost 0.005 % ophthalmic solution Commonly known as: XALATAN Place 1 drop into both eyes at bedtime.   levothyroxine 50 MCG tablet Commonly known as: SYNTHROID TAKE 1 TABLET (50 MCG TOTAL) BY MOUTH DAILY.   propranolol 10 MG tablet Commonly known as: INDERAL Take 1 tablet (10 mg total) by mouth 2 (two) times daily.   simvastatin 10 MG tablet Commonly known as: ZOCOR Take 1 tablet (10 mg total) by mouth every evening.   timolol 0.5 % ophthalmic solution Commonly known as: TIMOPTIC Place 1 drop into both eyes 2 (two) times daily.       Allergies:  Allergies  Allergen Reactions  . Doxycycline   . Latex   . Omeprazole Swelling  . Ranitidine     Developed bad heartburn and stomach pain  . Sulfa Antibiotics   . Benadryl [Diphenhydramine]     Feels "uneasy"  . Pce  [  Erythromycin] Rash    Family History: Family History  Problem Relation Age of Onset  . Stroke Mother   . Colon cancer Father   . Colon cancer Sister   . Lung cancer Sister   . Lung cancer Brother   . Diabetes Sister   . Heart disease Sister   . Heart disease Sister     Social History:  reports that he has never smoked. He has never used smokeless tobacco. He reports previous alcohol use. He reports that he does not use drugs.  ROS: UROLOGY Frequent Urination?: No Hard to postpone urination?: No Burning/pain with urination?: Yes Get up at night to urinate?: No Leakage of urine?: No Urine stream starts and stops?: No Trouble starting stream?: No Do you have to strain to urinate?: No Blood in urine?: No Urinary tract infection?: No Sexually  transmitted disease?: No Injury to kidneys or bladder?: No Painful intercourse?: No Weak stream?: No Erection problems?: No Penile pain?: No  Gastrointestinal Nausea?: No Vomiting?: No Indigestion/heartburn?: No Diarrhea?: No Constipation?: No  Constitutional Fever: No Night sweats?: No Weight loss?: No Fatigue?: No  Skin Skin rash/lesions?: No Itching?: No  Eyes Blurred vision?: Yes Double vision?: No  Ears/Nose/Throat Sore throat?: No Sinus problems?: No  Hematologic/Lymphatic Swollen glands?: No Easy bruising?: No  Cardiovascular Leg swelling?: No Chest pain?: No  Respiratory Cough?: No Shortness of breath?: No  Endocrine Excessive thirst?: No  Musculoskeletal Back pain?: No Joint pain?: No  Neurological Headaches?: No Dizziness?: No  Psychologic Depression?: No Anxiety?: No  Physical Exam: BP (!) 142/80 (BP Location: Left Arm, Patient Position: Sitting, Cuff Size: Normal)   Pulse 60   Ht 6' (1.829 m)   Wt 166 lb (75.3 kg)   BMI 22.51 kg/m   Constitutional:  Alert and oriented, No acute distress. HEENT: [place] AT, moist mucus membranes.  Trachea midline, no masses. Cardiovascular: No clubbing, cyanosis, or edema. Respiratory: Normal respiratory effort, no increased work of breathing. GI: Abdomen is soft, nontender, nondistended, no abdominal masses GU: Phallus without lesions or palpable abnormalities.  Urethral meatus normal in appearance.  Testes descended bilaterally, slightly atrophic. Skin: No rashes, bruises or suspicious lesions. Neurologic: Grossly intact, no focal deficits, moving all 4 extremities. Psychiatric: Normal mood and affect.  Laboratory Data:  Urinalysis Dipstick/microscopy negative  Assessment & Plan:   85 y.o. male with burning discomfort before and after voiding.  Urinalysis was unremarkable.  This was noted back in 2015 but recently recurred.  Unlikely an etiology will be identified however since it is bothersome  we will proceed with a noncontrast CT of the pelvis to evaluate for the remote possibility of a distal ureteral calculus.  If this is unrevealing he would like to proceed with cystoscopy.   Abbie Sons, [place] [address] [place], [place] [address] [phone]

## 2019-01-25 ENCOUNTER — Other Ambulatory Visit: Payer: Self-pay

## 2019-01-25 ENCOUNTER — Ambulatory Visit
Admission: RE | Admit: 2019-01-25 | Discharge: 2019-01-25 | Disposition: A | Discharge status: Home / Self Care | Payer: Medicare HMO | Source: Ambulatory Visit | Attending: Urology | Admitting: Urology

## 2019-01-25 DIAGNOSIS — K4090 Unilateral inguinal hernia, without obstruction or gangrene, not specified as recurrent: Secondary | ICD-10-CM | POA: Diagnosis not present

## 2019-01-25 DIAGNOSIS — R102 Pelvic and perineal pain: Secondary | ICD-10-CM | POA: Insufficient documentation

## 2019-01-27 ENCOUNTER — Telehealth: Payer: Self-pay | Admitting: Urology

## 2019-01-27 NOTE — Telephone Encounter (Signed)
Patient scheduled cysto and is aware of results   Jeffrey Hendrix

## 2019-01-27 NOTE — Telephone Encounter (Signed)
-----   Message from Abbie Sons, MD sent at 01/27/2019  1:02 PM EST ----- Pelvic CT showed no abnormalities.  At prior office visit he indicated he want to proceed with cystoscopy if CT was unremarkable.  Okay to schedule cystoscopy if he desires to proceed.

## 2019-02-02 ENCOUNTER — Encounter: Payer: Self-pay | Admitting: Urology

## 2019-02-02 ENCOUNTER — Other Ambulatory Visit: Payer: Self-pay

## 2019-02-02 ENCOUNTER — Ambulatory Visit (INDEPENDENT_AMBULATORY_CARE_PROVIDER_SITE_OTHER): Payer: Medicare HMO | Admitting: Urology

## 2019-02-02 VITALS — BP 120/72 | HR 68 | Ht 72.0 in | Wt 170.0 lb

## 2019-02-02 DIAGNOSIS — R300 Dysuria: Secondary | ICD-10-CM

## 2019-02-02 LAB — URINALYSIS, COMPLETE
Bilirubin, UA: NEGATIVE
Glucose, UA: NEGATIVE
Ketones, UA: NEGATIVE
Leukocytes,UA: NEGATIVE
Nitrite, UA: NEGATIVE
Protein,UA: NEGATIVE
Specific Gravity, UA: 1.025 (ref 1.005–1.030)
Urobilinogen, Ur: 0.2 mg/dL (ref 0.2–1.0)
pH, UA: 6 (ref 5.0–7.5)

## 2019-02-02 LAB — MICROSCOPIC EXAMINATION
Bacteria, UA: NONE SEEN
Epithelial Cells (non renal): NONE SEEN /hpf (ref 0–10)

## 2019-02-02 NOTE — Patient Instructions (Signed)
Patient to try Mybetriq 25 for a month, once daily

## 2019-02-02 NOTE — Progress Notes (Signed)
   02/02/19  CC:  Chief Complaint  Patient presents with  . Cysto    HPI: Chronic pelvic pain before and immediately after voiding.  See my note 01/05/2019.  CT was negative.  Blood pressure 120/72, pulse 68, height 6' (1.829 m), weight 170 lb (77.1 kg). NED. A&Ox3.   No respiratory distress   Abd soft, NT, ND Normal phallus with bilateral descended testicles  Cystoscopy Procedure Note  Patient identification was confirmed, informed consent was obtained, and patient was prepped using Betadine solution.  Lidocaine jelly was administered per urethral meatus.     Pre-Procedure: - Inspection reveals a normal caliber ureteral meatus.  Procedure: The flexible cystoscope was introduced without difficulty - No urethral strictures/lesions are present. - Surgically absent prostate  - Normal bladder neck - Bilateral ureteral orifices identified - Bladder mucosa  reveals no ulcers, tumors, or lesions - No bladder stones -Mild trabeculation  Retroflexion shows no abnormalities   Post-Procedure: - Patient tolerated the procedure well  Assessment/ Plan: No abnormalities on cystoscopy or CT to explain his symptoms.  Discussed the possibility of bladder spasms and will give a trial of Myrbetriq.  He was given samples 25 mg.  If no improvement consider trial of gabapentin.   Abbie Sons, MD

## 2019-02-07 ENCOUNTER — Other Ambulatory Visit: Payer: Self-pay | Admitting: Family Medicine

## 2019-02-27 ENCOUNTER — Ambulatory Visit: Payer: Self-pay | Admitting: Family Medicine

## 2019-03-08 DIAGNOSIS — Z08 Encounter for follow-up examination after completed treatment for malignant neoplasm: Secondary | ICD-10-CM | POA: Diagnosis not present

## 2019-03-08 DIAGNOSIS — D225 Melanocytic nevi of trunk: Secondary | ICD-10-CM | POA: Diagnosis not present

## 2019-03-08 DIAGNOSIS — L570 Actinic keratosis: Secondary | ICD-10-CM | POA: Diagnosis not present

## 2019-03-08 DIAGNOSIS — D2261 Melanocytic nevi of right upper limb, including shoulder: Secondary | ICD-10-CM | POA: Diagnosis not present

## 2019-03-08 DIAGNOSIS — D2262 Melanocytic nevi of left upper limb, including shoulder: Secondary | ICD-10-CM | POA: Diagnosis not present

## 2019-03-08 DIAGNOSIS — L821 Other seborrheic keratosis: Secondary | ICD-10-CM | POA: Diagnosis not present

## 2019-03-08 DIAGNOSIS — X32XXXA Exposure to sunlight, initial encounter: Secondary | ICD-10-CM | POA: Diagnosis not present

## 2019-03-08 DIAGNOSIS — Z85828 Personal history of other malignant neoplasm of skin: Secondary | ICD-10-CM | POA: Diagnosis not present

## 2019-03-20 DIAGNOSIS — H401133 Primary open-angle glaucoma, bilateral, severe stage: Secondary | ICD-10-CM | POA: Diagnosis not present

## 2019-03-20 DIAGNOSIS — H353131 Nonexudative age-related macular degeneration, bilateral, early dry stage: Secondary | ICD-10-CM | POA: Diagnosis not present

## 2019-03-27 ENCOUNTER — Ambulatory Visit: Payer: Self-pay | Admitting: Family Medicine

## 2019-03-27 DIAGNOSIS — H26491 Other secondary cataract, right eye: Secondary | ICD-10-CM | POA: Diagnosis not present

## 2019-04-12 NOTE — Progress Notes (Signed)
Patient: Jeffrey Hendrix. Male    DOB: 1925-01-02   86 y.o.   MRN: [id number] Visit Date: 04/17/2019  Today's Provider: Wilhemena Durie, MD   Chief Complaint  Patient presents with  . Hypertension   Subjective:     HPI  Patient stopped taking propranlol because it was making his bp drop too much and making him feel dizzy.  The tremor that he was taking it for is minimal.  Most of the time he does not even notice it.  He had urology work-up and it was normal.  His urinary symptoms have resolved.  Has a Tremor From 12/26/2018-Patient wishes treatment so we will try low-dose propanolol 10 mg bid.  Return to clinic 1 to 2 months.  Essential Hypertension  From 12/26/2018-Controlled. He says his blood pressure at home  runs about 135 over 60s.  Allergies  Allergen Reactions  . Doxycycline   . Latex   . Omeprazole Swelling  . Ranitidine     Developed bad heartburn and stomach pain  . Sulfa Antibiotics   . Benadryl [Diphenhydramine]     Feels "uneasy"  . Pce  [Erythromycin] Rash     Current Outpatient Medications:  .  hydrochlorothiazide (HYDRODIURIL) 12.5 MG tablet, TAKE 1/2 TABLET EVERY DAY, Disp: 45 tablet, Rfl: 2 .  latanoprost (XALATAN) 0.005 % ophthalmic solution, Place 1 drop into both eyes at bedtime., Disp: , Rfl:  .  levothyroxine (SYNTHROID) 50 MCG tablet, TAKE 1 TABLET (50 MCG TOTAL) BY MOUTH DAILY., Disp: 90 tablet, Rfl: 3 .  propranolol (INDERAL) 10 MG tablet, Take 1 tablet (10 mg total) by mouth 2 (two) times daily., Disp: 60 tablet, Rfl: 2 .  simvastatin (ZOCOR) 10 MG tablet, TAKE 1 TABLET EVERY EVENING, Disp: 90 tablet, Rfl: 0 .  timolol (TIMOPTIC) 0.5 % ophthalmic solution, Place 1 drop into both eyes 2 (two) times daily., Disp: 10 mL, Rfl: 11  Review of Systems  Constitutional: Negative for appetite change, chills and fever.  HENT: Negative.   Eyes: Negative.   Respiratory: Negative for chest tightness, shortness of breath and wheezing.    Cardiovascular: Negative for chest pain and palpitations.  Gastrointestinal: Negative for abdominal pain, nausea and vomiting.  Endocrine: Negative.   Allergic/Immunologic: Negative.   Neurological: Positive for tremors.  Psychiatric/Behavioral: Negative.     Social History   Tobacco Use  . Smoking status: Never Smoker  . Smokeless tobacco: Never Used  Substance Use Topics  . Alcohol use: Not Currently      Objective:   There were no vitals taken for this visit. There were no vitals filed for this visit.There is no height or weight on file to calculate BMI.   Physical Exam Vitals reviewed.  Constitutional:      Appearance: He is well-developed.  HENT:     Head: Normocephalic and atraumatic.  Eyes:     General: No scleral icterus.    Conjunctiva/sclera: Conjunctivae normal.  Neck:     Thyroid: No thyromegaly.  Cardiovascular:     Rate and Rhythm: Normal rate and regular rhythm.     Heart sounds: Normal heart sounds.  Pulmonary:     Effort: Pulmonary effort is normal.     Breath sounds: Normal breath sounds.  Abdominal:     Palpations: Abdomen is soft.  Musculoskeletal:     Right lower leg: No edema.     Left lower leg: No edema.  Skin:    General: Skin is  warm and dry.  Neurological:     General: No focal deficit present.     Mental Status: He is alert and oriented to person, place, and time.     Comments: No tremor noted today.  Psychiatric:        Mood and Affect: Mood normal.        Behavior: Behavior normal.        Thought Content: Thought content normal.        Judgment: Judgment normal.      No results found for any visits on 04/17/19.     Assessment & Plan    1. Essential (primary) hypertension Good control.  [place] hypertension. Recheck 6 months. 2. Adult hypothyroidism   3. Hyperlipidemia, unspecified hyperlipidemia type   4. Has a tremor No treatment necessary. 5.BPH Urology work-up negative.  CT and cystoscopy    Wilhemena Durie, MD  [facility]

## 2019-04-14 ENCOUNTER — Other Ambulatory Visit: Payer: Self-pay | Admitting: Family Medicine

## 2019-04-17 ENCOUNTER — Other Ambulatory Visit: Payer: Self-pay

## 2019-04-17 ENCOUNTER — Ambulatory Visit (INDEPENDENT_AMBULATORY_CARE_PROVIDER_SITE_OTHER): Payer: Medicare HMO | Admitting: Family Medicine

## 2019-04-17 ENCOUNTER — Encounter: Payer: Self-pay | Admitting: Family Medicine

## 2019-04-17 VITALS — BP 166/90 | HR 67 | Temp 96.6°F | Ht 72.0 in | Wt 171.2 lb

## 2019-04-17 DIAGNOSIS — E785 Hyperlipidemia, unspecified: Secondary | ICD-10-CM | POA: Diagnosis not present

## 2019-04-17 DIAGNOSIS — E039 Hypothyroidism, unspecified: Secondary | ICD-10-CM | POA: Diagnosis not present

## 2019-04-17 DIAGNOSIS — I10 Essential (primary) hypertension: Secondary | ICD-10-CM

## 2019-04-17 DIAGNOSIS — R251 Tremor, unspecified: Secondary | ICD-10-CM

## 2019-04-20 DIAGNOSIS — H524 Presbyopia: Secondary | ICD-10-CM | POA: Diagnosis not present

## 2019-04-20 DIAGNOSIS — Z0100 Encounter for examination of eyes and vision without abnormal findings: Secondary | ICD-10-CM | POA: Diagnosis not present

## 2019-05-15 ENCOUNTER — Other Ambulatory Visit: Payer: Self-pay | Admitting: Family Medicine

## 2019-05-15 DIAGNOSIS — E785 Hyperlipidemia, unspecified: Secondary | ICD-10-CM

## 2019-05-15 MED ORDER — SIMVASTATIN 10 MG PO TABS: 10.0000 mg | ORAL_TABLET | Freq: Every evening | ORAL | 2 refills | Status: DC

## 2019-05-15 NOTE — Telephone Encounter (Signed)
Refill sent to pharmacy.   

## 2019-05-15 NOTE — Telephone Encounter (Signed)
[facility] faxed refill request for the following medications:  simvastatin (ZOCOR) 10 MG tablet  Please advise.  Thanks, [employer]

## 2019-05-17 ENCOUNTER — Other Ambulatory Visit: Payer: Self-pay | Admitting: Family Medicine

## 2019-05-17 DIAGNOSIS — E039 Hypothyroidism, unspecified: Secondary | ICD-10-CM

## 2019-05-17 MED ORDER — LEVOTHYROXINE SODIUM 50 MCG PO TABS: 50.0000 ug | ORAL_TABLET | Freq: Every day | ORAL | 3 refills | Status: DC

## 2019-05-17 NOTE — Telephone Encounter (Signed)
[facility] faxed refill request for the following medications:   levothyroxine (SYNTHROID) 50 MCG tablet  Please advise.

## 2019-05-29 ENCOUNTER — Other Ambulatory Visit: Payer: Self-pay | Admitting: Family Medicine

## 2019-05-29 DIAGNOSIS — E039 Hypothyroidism, unspecified: Secondary | ICD-10-CM

## 2019-05-29 MED ORDER — LEVOTHYROXINE SODIUM 50 MCG PO TABS: 50.0000 ug | ORAL_TABLET | Freq: Every day | ORAL | 3 refills | Status: DC

## 2019-05-29 NOTE — Telephone Encounter (Signed)
[facility] faxed refill request for the following medications:   levothyroxine (SYNTHROID) 50 MCG tablet    Please advise.  Thanks, [employer]

## 2019-07-13 ENCOUNTER — Other Ambulatory Visit: Payer: Self-pay | Admitting: Family Medicine

## 2019-08-14 DIAGNOSIS — D485 Neoplasm of uncertain behavior of skin: Secondary | ICD-10-CM | POA: Diagnosis not present

## 2019-08-14 DIAGNOSIS — X32XXXA Exposure to sunlight, initial encounter: Secondary | ICD-10-CM | POA: Diagnosis not present

## 2019-08-14 DIAGNOSIS — D0439 Carcinoma in situ of skin of other parts of face: Secondary | ICD-10-CM | POA: Diagnosis not present

## 2019-08-14 DIAGNOSIS — L309 Dermatitis, unspecified: Secondary | ICD-10-CM | POA: Diagnosis not present

## 2019-08-14 DIAGNOSIS — L570 Actinic keratosis: Secondary | ICD-10-CM | POA: Diagnosis not present

## 2019-08-14 DIAGNOSIS — L308 Other specified dermatitis: Secondary | ICD-10-CM | POA: Diagnosis not present

## 2019-09-15 DIAGNOSIS — H401133 Primary open-angle glaucoma, bilateral, severe stage: Secondary | ICD-10-CM | POA: Diagnosis not present

## 2019-09-20 DIAGNOSIS — D0439 Carcinoma in situ of skin of other parts of face: Secondary | ICD-10-CM | POA: Diagnosis not present

## 2019-10-24 ENCOUNTER — Telehealth: Payer: Self-pay

## 2019-10-24 NOTE — Telephone Encounter (Signed)
With HA only I think appt with Simona Huh is available today.Very Low risk for covid in this 86 yo.

## 2019-10-24 NOTE — Telephone Encounter (Signed)
Copied from [place] [phone]. Topic: Appointment Scheduling - Scheduling Inquiry for Clinic >> Oct 24, 2019  9:50 AM Yvette Rack wrote: Reason for CRM: Pt requests in office appt but answered yes to having a headache. Pt declined virtual appt and requests to be contacted for an in office appt.  Please advise.

## 2019-10-24 NOTE — Telephone Encounter (Signed)
Scheduled appt.

## 2019-10-24 NOTE — Telephone Encounter (Signed)
LMOVM for pt to return call 

## 2019-10-25 ENCOUNTER — Ambulatory Visit (INDEPENDENT_AMBULATORY_CARE_PROVIDER_SITE_OTHER): Payer: Medicare HMO | Admitting: Family Medicine

## 2019-10-25 ENCOUNTER — Other Ambulatory Visit: Payer: Self-pay | Admitting: Family Medicine

## 2019-10-25 ENCOUNTER — Encounter: Payer: Self-pay | Admitting: Family Medicine

## 2019-10-25 ENCOUNTER — Other Ambulatory Visit: Payer: Self-pay

## 2019-10-25 VITALS — BP 174/82 | HR 83 | Temp 97.9°F | Wt 168.0 lb

## 2019-10-25 DIAGNOSIS — R519 Headache, unspecified: Secondary | ICD-10-CM

## 2019-10-25 DIAGNOSIS — I10 Essential (primary) hypertension: Secondary | ICD-10-CM

## 2019-10-25 DIAGNOSIS — E785 Hyperlipidemia, unspecified: Secondary | ICD-10-CM

## 2019-10-25 DIAGNOSIS — H409 Unspecified glaucoma: Secondary | ICD-10-CM

## 2019-10-25 NOTE — Progress Notes (Signed)
Established patient visit   Patient: Jeffrey Hendrix.   DOB: Mar 24, 1924   86 y.o. Male  MRN: [id number] Visit Date: 10/25/2019  Today's healthcare provider: Wilhemena Durie, MD   Chief Complaint  Patient presents with  . Headache   Subjective    Headache  This is a new problem. Episode onset: Started about 2-3 weeks ago. The problem has been waxing and waning. The pain is located in the right unilateral (Right front.  ) region. The pain does not radiate. The pain quality is not similar to prior headaches. Associated symptoms include photophobia. Pertinent negatives include no abdominal pain, dizziness, ear pain, fever, insomnia, loss of balance, nausea, phonophobia, seizures, sinus pressure, tinnitus, vomiting or weakness. He has tried nothing for the symptoms.    Headache occurs mainly in the right side of the forehead and some in the left. He has a recent history of glaucoma and thinks the eyedrops may have precipitated the headache.  Tramadol has helped the headache some recently.  There has been no rash, he calls it a "light headache".  He states the pain is "medium", not sharp dull throbbing or burning     Medications: Outpatient Medications Prior to Visit  Medication Sig  . hydrochlorothiazide (HYDRODIURIL) 12.5 MG tablet TAKE 1/2 TABLET EVERY DAY  . latanoprost (XALATAN) 0.005 % ophthalmic solution Place 1 drop into both eyes at bedtime.  Marland Kitchen levothyroxine (SYNTHROID) 50 MCG tablet Take 1 tablet (50 mcg total) by mouth daily.  . propranolol (INDERAL) 10 MG tablet Take 1 tablet (10 mg total) by mouth 2 (two) times daily. (Patient not taking: Reported on 04/17/2019)  . simvastatin (ZOCOR) 10 MG tablet Take 1 tablet (10 mg total) by mouth every evening.  . timolol (TIMOPTIC) 0.5 % ophthalmic solution Place 1 drop into both eyes 2 (two) times daily.   No facility-administered medications prior to visit.    Review of Systems  Constitutional: Negative.  Negative for  appetite change, chills and fever.  HENT: Negative for ear pain, sinus pressure, sinus pain and tinnitus.   Eyes: Positive for photophobia.  Respiratory: Negative for chest tightness, shortness of breath and wheezing.   Cardiovascular: Negative for chest pain and palpitations.  Gastrointestinal: Negative for abdominal pain, nausea and vomiting.  Neurological: Positive for headaches. Negative for dizziness, seizures, speech difficulty, weakness, light-headedness and loss of balance.  Psychiatric/Behavioral: The patient does not have insomnia.       Objective    BP (!) 174/82 (BP Location: Left Arm, Patient Position: Sitting, Cuff Size: Normal)   Pulse 83   Temp 97.9 F (36.6 C) (Oral)   Wt 168 lb (76.2 kg)   BMI 22.78 kg/m    Physical Exam Vitals reviewed.  Constitutional:      Appearance: He is normal weight.  HENT:     Head: Normocephalic and atraumatic.  Cardiovascular:     Rate and Rhythm: Normal rate and regular rhythm.     Heart sounds: Normal heart sounds.  Pulmonary:     Effort: Pulmonary effort is normal.     Breath sounds: Normal breath sounds.  Abdominal:     Palpations: Abdomen is soft.  Neurological:     Mental Status: He is alert.     Cranial Nerves: No cranial nerve deficit.     Motor: No weakness.     Coordination: Coordination normal.     Gait: Gait normal.  Psychiatric:        Mood and Affect:  Mood normal.        Speech: Speech normal.        Behavior: Behavior normal.       No results found for any visits on 10/25/19.  Assessment & Plan     1. Nonintractable headache, unspecified chronicity pattern, unspecified headache type I think this is a benign headache.  Obtain sed rate to rule out temporal arteritis.  He is not tender over temporal arteries.  Try tramadol for the pain.  It may be from his eyedrops. - CBC with Differential/Platelet - Sed Rate (ESR)  2. Essential (primary) hypertension Elevated today but evidently controlled at home.   May need to increase his propanolol which he takes for tremor. Also consider adding amlodipine - Comp. Metabolic Panel (12) - TSH  3. Hyperlipidemia, unspecified hyperlipidemia type On simvastatin  - Comp. Metabolic Panel (12) - Lipid panel  4. Glaucoma of both eyes, unspecified glaucoma type Per ophthalmology - Sed Rate (ESR)   No follow-ups on file.      I, Wilhemena Durie, MD, have reviewed all documentation for this visit. The documentation on 10/31/19 for the exam, diagnosis, procedures, and orders are all accurate and complete.    Richard Cranford Mon, MD  [facility] [phone] (phone) [phone] (fax)  [place]

## 2019-10-26 DIAGNOSIS — I10 Essential (primary) hypertension: Secondary | ICD-10-CM | POA: Diagnosis not present

## 2019-10-26 DIAGNOSIS — R519 Headache, unspecified: Secondary | ICD-10-CM | POA: Diagnosis not present

## 2019-10-26 DIAGNOSIS — E785 Hyperlipidemia, unspecified: Secondary | ICD-10-CM | POA: Diagnosis not present

## 2019-10-26 DIAGNOSIS — H409 Unspecified glaucoma: Secondary | ICD-10-CM | POA: Diagnosis not present

## 2019-10-26 MED ORDER — TRAMADOL HCL 50 MG PO TABS: 50.0000 mg | ORAL_TABLET | Freq: Three times a day (TID) | ORAL | 1 refills | Status: AC | PRN

## 2019-10-27 LAB — COMP. METABOLIC PANEL (12)
AST: 14 IU/L (ref 0–40)
Albumin/Globulin Ratio: 1.7 (ref 1.2–2.2)
Albumin: 4 g/dL (ref 3.5–4.6)
Alkaline Phosphatase: 75 IU/L (ref 44–121)
BUN/Creatinine Ratio: 11 (ref 10–24)
BUN: 13 mg/dL (ref 10–36)
Bilirubin Total: 0.7 mg/dL (ref 0.0–1.2)
Calcium: 9 mg/dL (ref 8.6–10.2)
Chloride: 103 mmol/L (ref 96–106)
Creatinine, Ser: 1.17 mg/dL (ref 0.76–1.27)
GFR calc Af Amer: 62 mL/min/{1.73_m2} (ref >59)
GFR calc non Af Amer: 53 mL/min/{1.73_m2} — ABNORMAL LOW (ref >59)
Globulin, Total: 2.4 g/dL (ref 1.5–4.5)
Glucose: 92 mg/dL (ref 65–99)
Potassium: 4.4 mmol/L (ref 3.5–5.2)
Sodium: 139 mmol/L (ref 134–144)
Total Protein: 6.4 g/dL (ref 6.0–8.5)

## 2019-10-27 LAB — CBC WITH DIFFERENTIAL/PLATELET
Basophils Absolute: 0 10*3/uL (ref 0.0–0.2)
Basos: 1 %
EOS (ABSOLUTE): 0.3 10*3/uL (ref 0.0–0.4)
Eos: 5 %
Hematocrit: 46.2 % (ref 37.5–51.0)
Hemoglobin: 15.4 g/dL (ref 13.0–17.7)
Immature Grans (Abs): 0 10*3/uL (ref 0.0–0.1)
Immature Granulocytes: 0 %
Lymphocytes Absolute: 1.8 10*3/uL (ref 0.7–3.1)
Lymphs: 33 %
MCH: 30.9 pg (ref 26.6–33.0)
MCHC: 33.3 g/dL (ref 31.5–35.7)
MCV: 93 fL (ref 79–97)
Monocytes Absolute: 0.6 10*3/uL (ref 0.1–0.9)
Monocytes: 11 %
Neutrophils Absolute: 2.8 10*3/uL (ref 1.4–7.0)
Neutrophils: 50 %
Platelets: 270 10*3/uL (ref 150–450)
RBC: 4.99 x10E6/uL (ref 4.14–5.80)
RDW: 13.1 % (ref 11.6–15.4)
WBC: 5.6 10*3/uL (ref 3.4–10.8)

## 2019-10-27 LAB — LIPID PANEL
Chol/HDL Ratio: 3.2 ratio (ref 0.0–5.0)
Cholesterol, Total: 159 mg/dL (ref 100–199)
HDL: 49 mg/dL (ref >39)
LDL Chol Calc (NIH): 87 mg/dL (ref 0–99)
Triglycerides: 129 mg/dL (ref 0–149)
VLDL Cholesterol Cal: 23 mg/dL (ref 5–40)

## 2019-10-27 LAB — TSH: TSH: 4.21 u[IU]/mL (ref 0.450–4.500)

## 2019-10-27 LAB — SEDIMENTATION RATE: Sed Rate: 3 mm/hr (ref 0–30)

## 2019-10-30 DIAGNOSIS — H401133 Primary open-angle glaucoma, bilateral, severe stage: Secondary | ICD-10-CM | POA: Diagnosis not present

## 2019-10-31 ENCOUNTER — Telehealth: Payer: Self-pay

## 2019-10-31 NOTE — Telephone Encounter (Signed)
Copied from [place] [phone]. Topic: Quick Communication - Lab Results (Clinic Use ONLY) >> Oct 31, 2019 10:45 AM Scherrie Gerlach wrote: Wife calling because pt has not been contacted about his lab results. She states they are going to come by office after 1:20 today and pick up a copy.

## 2019-11-01 NOTE — Telephone Encounter (Signed)
Tried calling pt. No answer and no vm.  

## 2019-11-07 NOTE — Telephone Encounter (Signed)
Patient's wife was given copy of results.

## 2019-12-20 ENCOUNTER — Other Ambulatory Visit: Payer: Self-pay | Admitting: Family Medicine

## 2019-12-20 DIAGNOSIS — E785 Hyperlipidemia, unspecified: Secondary | ICD-10-CM

## 2019-12-29 DIAGNOSIS — H401133 Primary open-angle glaucoma, bilateral, severe stage: Secondary | ICD-10-CM | POA: Diagnosis not present

## 2019-12-29 NOTE — Progress Notes (Signed)
I,April Miller,acting as a scribe for Wilhemena Durie, MD.,have documented all relevant documentation on the behalf of Wilhemena Durie, MD,as directed by  Wilhemena Durie, MD while in the presence of Wilhemena Durie, MD.  Annual Wellness Visit     Patient: Jeffrey Hendrix., Male    DOB: [birth year], 86 y.o.   MRN: [id number] Visit Date: 01/01/2020  Today's Provider: Wilhemena Durie, MD   Chief Complaint  Patient presents with  . Medicare [place] Deanglo Hissong. is a 86 y.o. male who presents today for his Annual Wellness Visit.Annual physical. He reports consuming a general and low sodium diet. The patient does not participate in regular exercise at present. He generally feels well. He reports sleeping fairly well. He does not have additional problems to discuss today.  He has been married for 12 years and has 2 daughters and 4 grandchildren.  Blood pressure at home runs 1 20-1 30 over 70s HPI He has occasional ""trick knee".  He also complains of occasional discomfort in his legs.  It is very short lasting and he is very nonspecific.  Appears to be muscular in them mainly the thighs. He had laser surgery for glaucoma 4 days ago.     Medications: Outpatient Medications Prior to Visit  Medication Sig  . hydrochlorothiazide (HYDRODIURIL) 12.5 MG tablet TAKE 1/2 TABLET EVERY DAY  . latanoprost (XALATAN) 0.005 % ophthalmic solution Place 1 drop into both eyes at bedtime.  Marland Kitchen levothyroxine (SYNTHROID) 50 MCG tablet Take 1 tablet (50 mcg total) by mouth daily.  . simvastatin (ZOCOR) 10 MG tablet TAKE 1 TABLET (10 MG TOTAL) BY MOUTH EVERY EVENING.  Marland Kitchen timolol (TIMOPTIC) 0.5 % ophthalmic solution Place 1 drop into both eyes 2 (two) times daily.  . propranolol (INDERAL) 10 MG tablet Take 1 tablet (10 mg total) by mouth 2 (two) times daily. (Patient not taking: No sig reported)   No facility-administered medications prior to visit.     Allergies  Allergen Reactions  . Doxycycline   . Latex   . Omeprazole Swelling  . Ranitidine     Developed bad heartburn and stomach pain  . Sulfa Antibiotics   . Benadryl [Diphenhydramine]     Feels "uneasy"  . Pce  [Erythromycin] Rash    Patient Care Team: Jerrol Banana., MD as PCP - General (Family Medicine) Leandrew Koyanagi, MD as Referring Physician (Ophthalmology) Dasher, Rayvon Char, MD (Dermatology)  Review of Systems  All other systems reviewed and are negative.     Objective    Vitals: BP (!) 171/78 (BP Location: Right Arm, Patient Position: Sitting, Cuff Size: Large)   Pulse 74   Temp 98.4 F (36.9 C) (Oral)   Resp 16   Ht 6' (1.829 m)   Wt 169 lb (76.7 kg)   SpO2 98%   BMI 22.92 kg/m    Physical Exam Vitals reviewed.  Constitutional:      Appearance: He is well-developed.     Comments: 86 year old gentleman appears much younger than his age.  HENT:     Head: Normocephalic and atraumatic.     Right Ear: External ear normal.     Left Ear: External ear normal.     Nose: Nose normal.  Eyes:     Conjunctiva/sclera: Conjunctivae normal.     Pupils: Pupils are equal, round, and reactive to light.  Cardiovascular:     Rate and Rhythm: Normal rate  and regular rhythm.     Heart sounds: Normal heart sounds.  Pulmonary:     Effort: Pulmonary effort is normal.     Breath sounds: Normal breath sounds.  Abdominal:     General: Bowel sounds are normal.     Palpations: Abdomen is soft.  Genitourinary:    Penis: Normal.      Prostate: Normal.     Rectum: Normal.  Musculoskeletal:        General: Normal range of motion.     Cervical back: Normal range of motion and neck supple.  Skin:    General: Skin is warm and dry.  Neurological:     General: No focal deficit present.     Mental Status: He is alert and oriented to person, place, and time.     Comments: He has a very mild head tremor and bilateral hand tremor.  This appears to be more of an  essential tremor than anything else.  Psychiatric:        Mood and Affect: Mood normal.        Behavior: Behavior normal.        Thought Content: Thought content normal.        Judgment: Judgment normal.      Most recent functional status assessment: In your present state of health, do you have any difficulty performing the following activities: 01/01/2020  Hearing? N  Vision? Y  Difficulty concentrating or making decisions? N  Walking or climbing stairs? N  Dressing or bathing? N  Doing errands, shopping? N  Some recent data might be hidden   Most recent fall risk assessment: Fall Risk  01/01/2020  Falls in the past year? 0  Number falls in past yr: 0  Injury with Fall? 0  Follow up Falls evaluation completed    Most recent depression screenings: PHQ 2/9 Scores 01/01/2020 12/26/2018  PHQ - 2 Score 0 0  PHQ- 9 Score 0 -   Most recent cognitive screening: No flowsheet data found. Most recent Audit-C alcohol use screening Alcohol Use Disorder Test (AUDIT) 01/01/2020  1. How often do you have a drink containing alcohol? 0  2. How many drinks containing alcohol do you have on a typical day when you are drinking? 0  3. How often do you have six or more drinks on one occasion? 0  AUDIT-C Score 0  Alcohol Brief Interventions/Follow-up AUDIT Score <7 follow-up not indicated   A score of 3 or more in women, and 4 or more in men indicates increased risk for alcohol abuse, EXCEPT if all of the points are from question 1   No results found for any visits on 01/01/20.  Assessment & Plan     Annual wellness visit done today including the all of the following: Reviewed patient's Family Medical History Reviewed and updated list of patient's medical providers Assessment of cognitive impairment was done Assessed patient's functional ability Established a written schedule for health screening [place] Completed and Reviewed  Exercise Activities and Dietary  recommendations Goals    . DIET - INCREASE WATER INTAKE     Recommend to drink at least 6-8 8oz glasses of water per day.       Immunization History  Administered Date(s) Administered  . Fluad Quad(high Dose 65+) 10/10/2018  . Influenza, High Dose Seasonal PF 11/26/2015, 10/28/2016  . Influenza-Unspecified 11/23/2017  . Pneumococcal Polysaccharide-23 01/25/1992  . Td 01/22/2010  . Tdap 08/02/2012  . Zoster 01/16/2013  Health Maintenance  Topic Date Due  . COVID-19 Vaccine (1) Never done  . PNA vac Low Risk Adult (2 of 2 - PCV13) 01/24/1993  . INFLUENZA VACCINE  08/20/2019  . TETANUS/TDAP  08/03/2022     Discussed health benefits of physical activity, and encouraged him to engage in regular exercise appropriate for his age and condition.   1. Encounter for Medicare annual wellness exam   2. Annual physical exam Patient appears much younger than his age of 72.  Do you to remain active.  3. Need for influenza vaccination  - Flu Vaccine QUAD High Dose(Fluad)  4. Essential (primary) hypertension   5. Adult hypothyroidism   6. Benign head tremor     No follow-ups on file.        Khamani Daniely Cranford Mon, MD  [facility] [phone] (phone) [phone] (fax)  [place]

## 2020-01-01 ENCOUNTER — Ambulatory Visit (INDEPENDENT_AMBULATORY_CARE_PROVIDER_SITE_OTHER): Payer: Medicare HMO | Admitting: Family Medicine

## 2020-01-01 ENCOUNTER — Other Ambulatory Visit: Payer: Self-pay

## 2020-01-01 ENCOUNTER — Encounter: Payer: Self-pay | Admitting: Family Medicine

## 2020-01-01 VITALS — BP 171/78 | HR 74 | Temp 98.4°F | Resp 16 | Ht 72.0 in | Wt 169.0 lb

## 2020-01-01 DIAGNOSIS — Z23 Encounter for immunization: Secondary | ICD-10-CM | POA: Diagnosis not present

## 2020-01-01 DIAGNOSIS — I10 Essential (primary) hypertension: Secondary | ICD-10-CM

## 2020-01-01 DIAGNOSIS — G250 Essential tremor: Secondary | ICD-10-CM | POA: Diagnosis not present

## 2020-01-01 DIAGNOSIS — E039 Hypothyroidism, unspecified: Secondary | ICD-10-CM

## 2020-01-01 DIAGNOSIS — Z0000 Encounter for general adult medical examination without abnormal findings: Secondary | ICD-10-CM | POA: Diagnosis not present

## 2020-02-13 DIAGNOSIS — H401133 Primary open-angle glaucoma, bilateral, severe stage: Secondary | ICD-10-CM | POA: Diagnosis not present

## 2020-03-04 ENCOUNTER — Other Ambulatory Visit: Payer: Self-pay | Admitting: Family Medicine

## 2020-03-11 DIAGNOSIS — L821 Other seborrheic keratosis: Secondary | ICD-10-CM | POA: Diagnosis not present

## 2020-03-11 DIAGNOSIS — L570 Actinic keratosis: Secondary | ICD-10-CM | POA: Diagnosis not present

## 2020-03-11 DIAGNOSIS — L308 Other specified dermatitis: Secondary | ICD-10-CM | POA: Diagnosis not present

## 2020-03-11 DIAGNOSIS — L309 Dermatitis, unspecified: Secondary | ICD-10-CM | POA: Diagnosis not present

## 2020-03-11 DIAGNOSIS — Z85828 Personal history of other malignant neoplasm of skin: Secondary | ICD-10-CM | POA: Diagnosis not present

## 2020-03-11 DIAGNOSIS — D2339 Other benign neoplasm of skin of other parts of face: Secondary | ICD-10-CM | POA: Diagnosis not present

## 2020-03-11 DIAGNOSIS — Z08 Encounter for follow-up examination after completed treatment for malignant neoplasm: Secondary | ICD-10-CM | POA: Diagnosis not present

## 2020-04-08 ENCOUNTER — Other Ambulatory Visit: Payer: Self-pay | Admitting: Family Medicine

## 2020-04-08 NOTE — Telephone Encounter (Signed)
Requested Prescriptions  Pending Prescriptions Disp Refills  . hydrochlorothiazide (HYDRODIURIL) 12.5 MG tablet [Pharmacy Med Name: HYDROCHLOROTHIAZIDE 12.5 MG Tablet] 45 tablet 0    Sig: TAKE 1/2 TABLET EVERY DAY     Cardiovascular: Diuretics - Thiazide Failed - 04/08/2020  1:54 PM      Failed - Last BP in normal range    BP Readings from Last 1 Encounters:  01/01/20 (!) 171/78         Passed - Ca in normal range and within 360 days    Calcium  Date Value Ref Range Status  10/26/2019 9.0 8.6 - 10.2 mg/dL Final   Calcium, Total  Date Value Ref Range Status  04/25/2014 9.5 mg/dL Final    Comment:    8.9-10.3 NOTE: New Reference Range  03/27/14          Passed - Cr in normal range and within 360 days    Creat  Date Value Ref Range Status  12/15/2016 1.19 (H) 0.70 - 1.11 mg/dL Final    Comment:    For patients >85 years of age, the reference limit for Creatinine is approximately 13% higher for people identified as African-American. .    Creatinine, Ser  Date Value Ref Range Status  10/26/2019 1.17 0.76 - 1.27 mg/dL Final         Passed - K in normal range and within 360 days    Potassium  Date Value Ref Range Status  10/26/2019 4.4 3.5 - 5.2 mmol/L Final  04/25/2014 4.0 mmol/L Final    Comment:    3.5-5.1 NOTE: New Reference Range  03/27/14          Passed - Na in normal range and within 360 days    Sodium  Date Value Ref Range Status  10/26/2019 139 134 - 144 mmol/L Final  04/25/2014 138 mmol/L Final    Comment:    135-145 NOTE: New Reference Range  03/27/14          Passed - Valid encounter within last 6 months    Recent Outpatient Visits          3 months ago Encounter for Medicare annual wellness exam   [facility] Jerrol Banana., MD   5 months ago Nonintractable headache, unspecified chronicity pattern, unspecified headache type   [facility] Jerrol Banana., MD   11 months ago Essential (primary)  hypertension   [facility] Jerrol Banana., MD   1 year ago Annual physical exam   [facility] Jerrol Banana., MD   1 year ago [place] Jerrol Banana., MD      Future Appointments            In 2 months Jerrol Banana., MD [facility], [facility]            Mail order

## 2020-04-11 ENCOUNTER — Telehealth: Payer: Self-pay | Admitting: Family Medicine

## 2020-04-11 NOTE — Telephone Encounter (Signed)
Patients wife advised and verbalized understanding.  

## 2020-04-11 NOTE — Telephone Encounter (Signed)
She should contact the pharmacy to see if he is on the one that has been recalled. If so, he can stop this medication and f/u with Dr. Rosanna Randy to see if it is even needed at this time

## 2020-04-11 NOTE — Telephone Encounter (Signed)
Pt wife Jeffrey Hendrix  is calling and hctz has been voluntary recall by [employer] and pt is on that medication and would like to know what to do

## 2020-05-13 DIAGNOSIS — H401133 Primary open-angle glaucoma, bilateral, severe stage: Secondary | ICD-10-CM | POA: Diagnosis not present

## 2020-05-20 IMAGING — CT CT PELVIS W/O CM
2 of 4 series · 17 of 46 positions shown, 19 images · non-contrast
Comparison: None

CLINICAL DATA: Burning with urination, no history of kidney stones,
history of prostate cancer.

EXAM:
CT PELVIS WITHOUT CONTRAST
TECHNIQUE: Multidetector CT imaging of the pelvis was performed following the
standard protocol without intravenous contrast.

[Series 2: axials routine abdomen pelvis without 5.00 · axial · non-contrast · 0.76mm/px · z∈[-1727,-1427]mm · 14 of 70 slices shown, 16 images]
[im 5/70  soft-tissue]
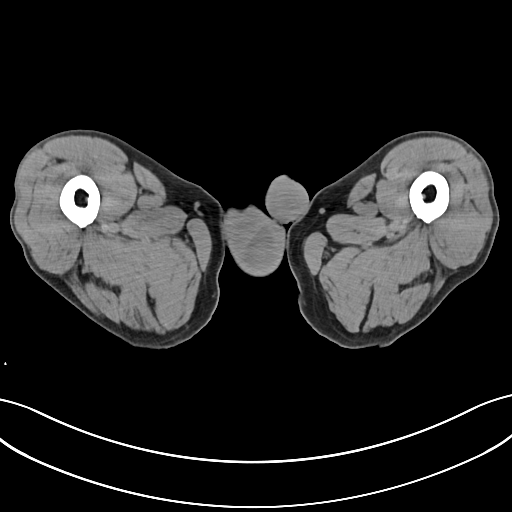
[im 5/70  bone]
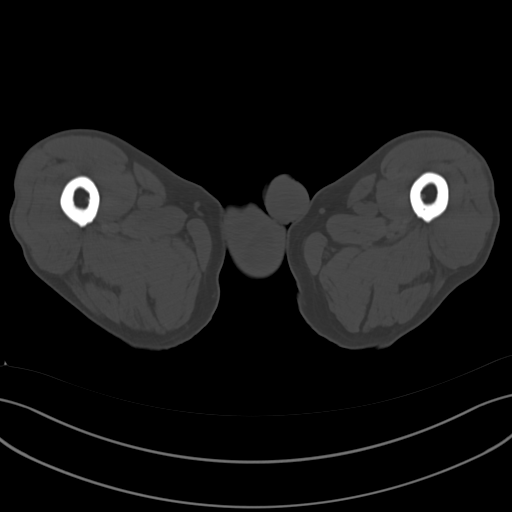
[im 10/70  soft-tissue]
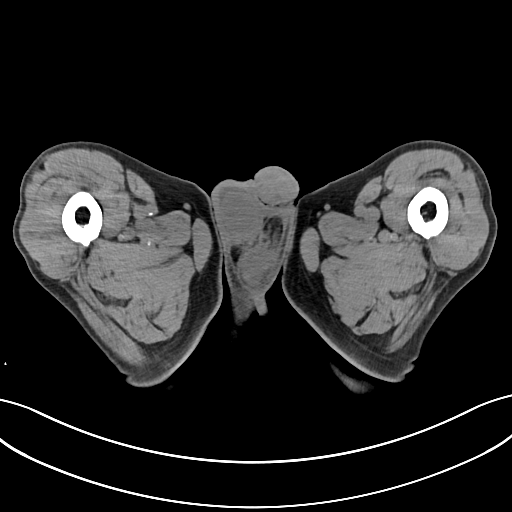
[im 14/70  soft-tissue]
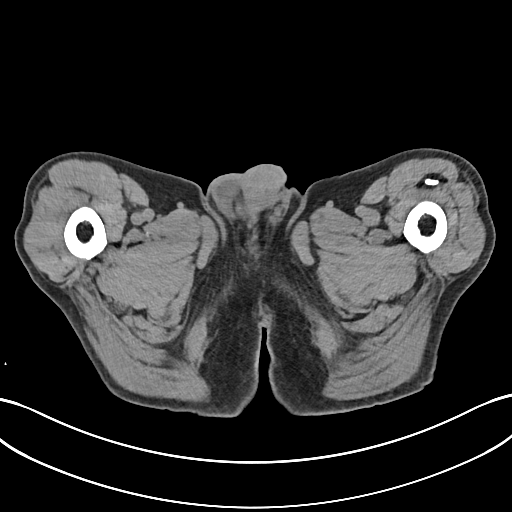
[im 19/70  soft-tissue]
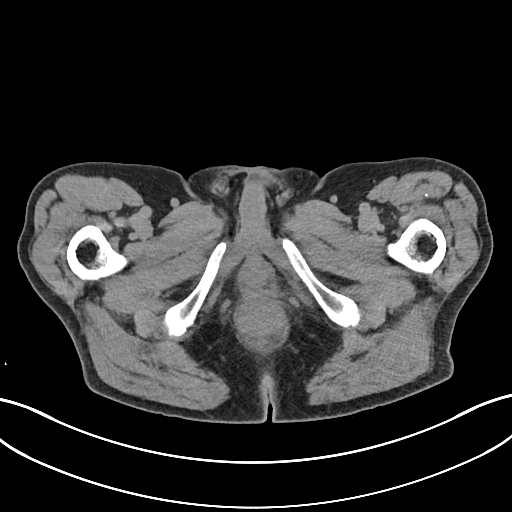
[im 24/70  soft-tissue]
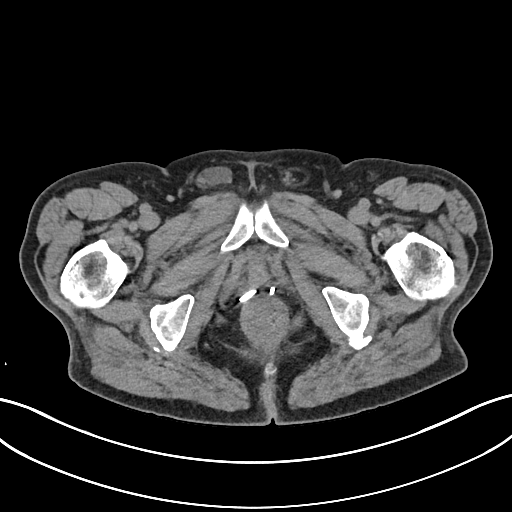
[im 28/70  soft-tissue]
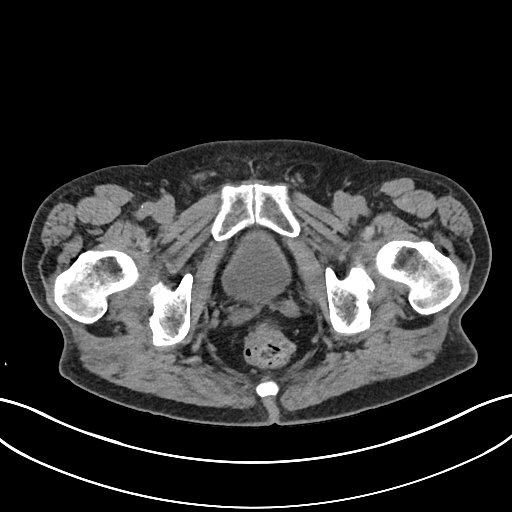
[im 33/70  soft-tissue]
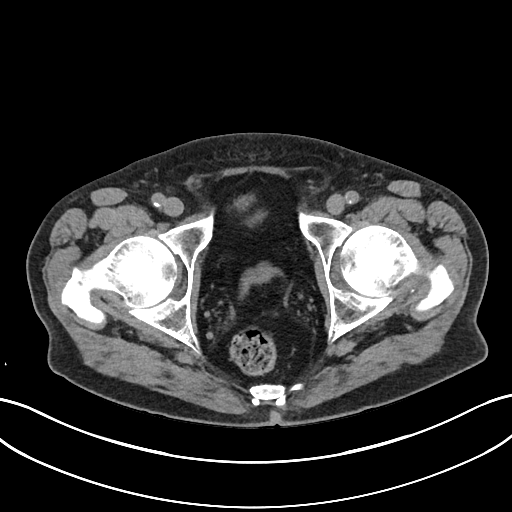
[im 37/70  soft-tissue]
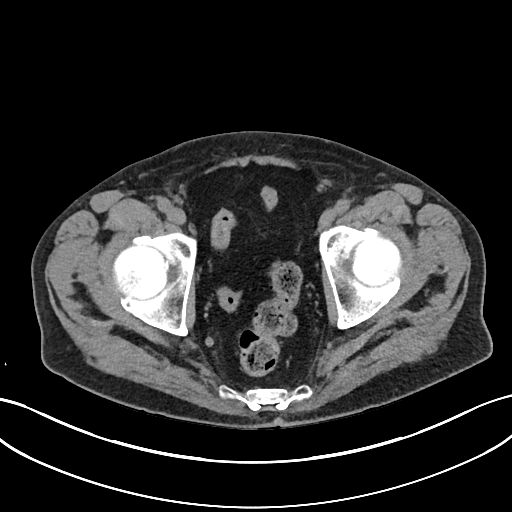
[im 42/70  soft-tissue]
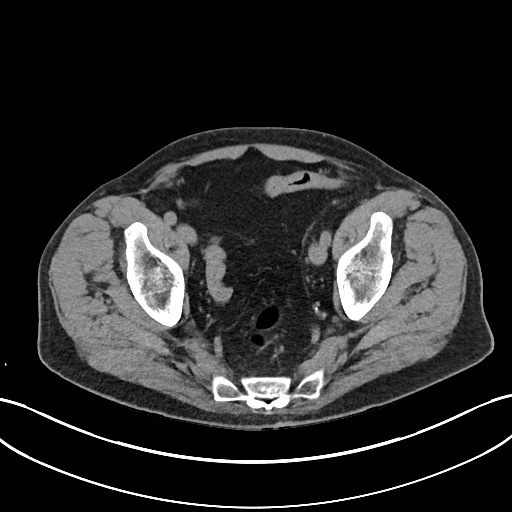
[im 42/70  bone]
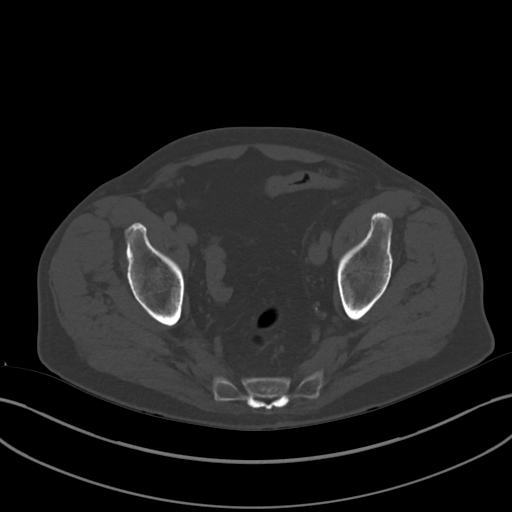
[im 47/70  soft-tissue]
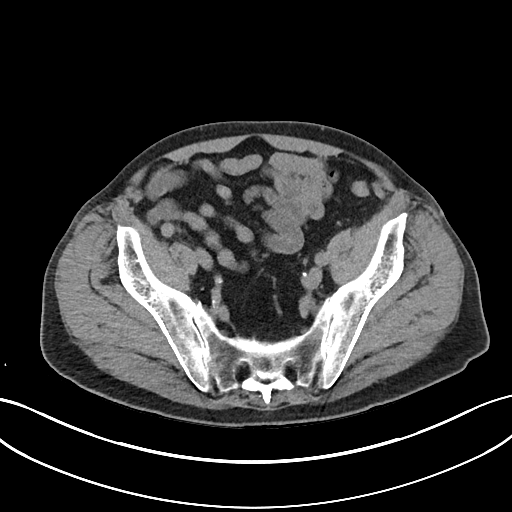
[im 51/70  soft-tissue]
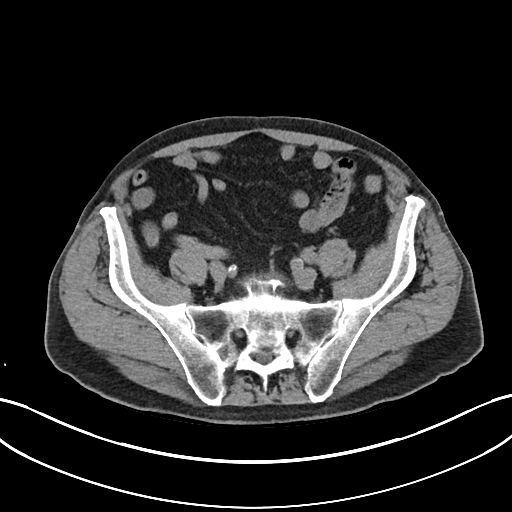
[im 56/70  soft-tissue]
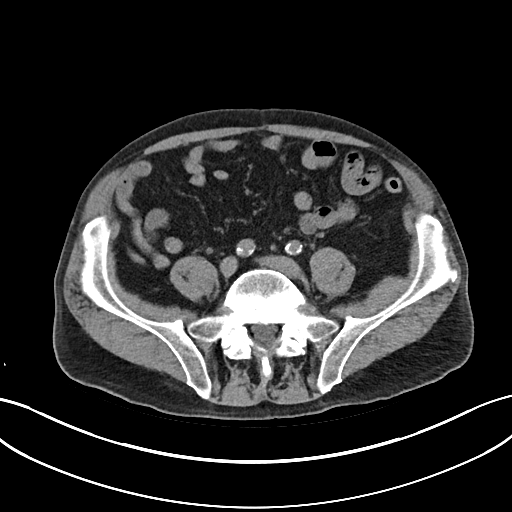
[im 60/70  soft-tissue]
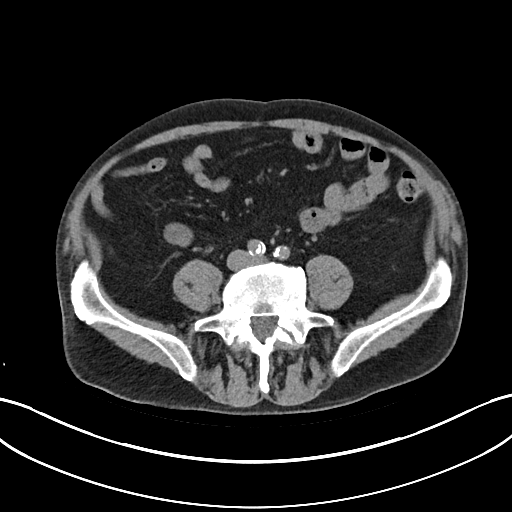
[im 65/70  soft-tissue]
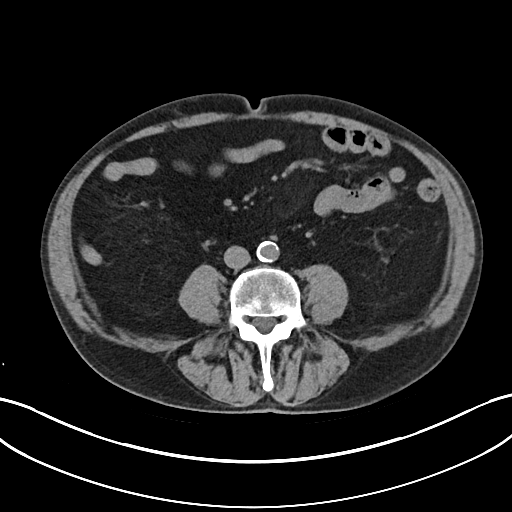

[Series 4: coronals routine abdomen pelvis without 2.00 cor · coronal · non-contrast · 0.68mm/px · 3 of 132 slices shown]
[im 44/132  soft-tissue]
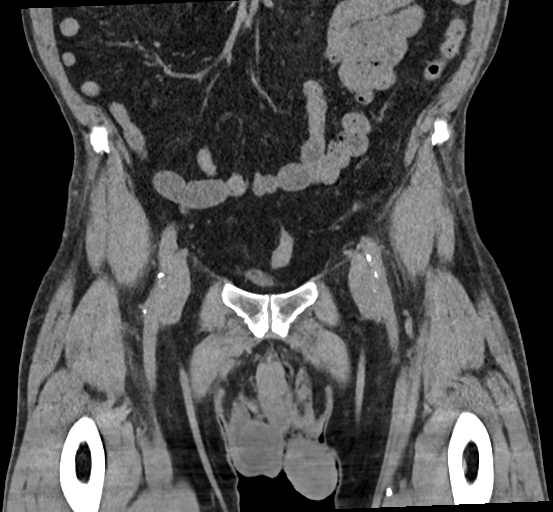
[im 59/132  soft-tissue]
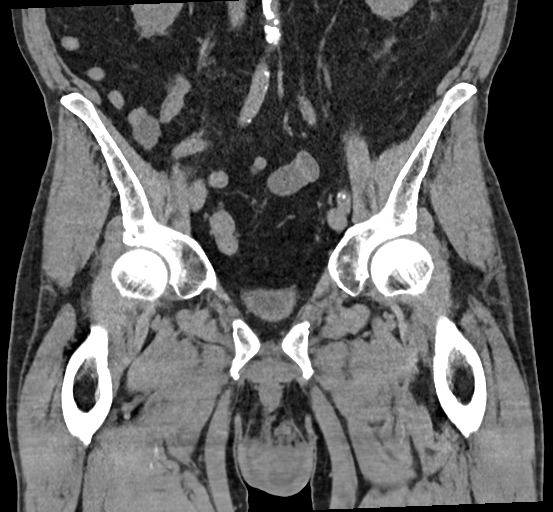
[im 73/132  soft-tissue]
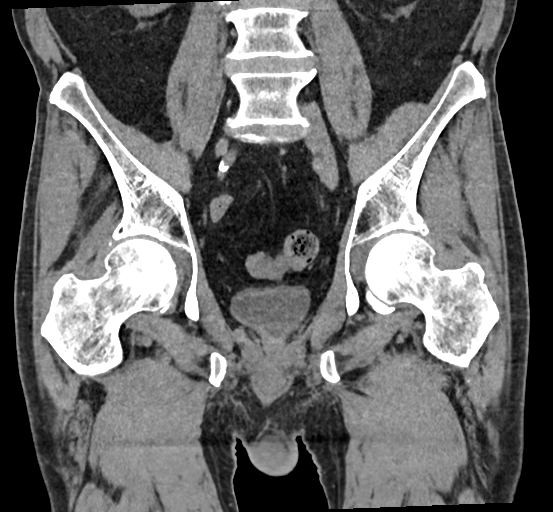

[17 of 46 positions shown; findings below may reference images not displayed]

FINDINGS: Urinary Tract: No signs of ureteral dilation. Urinary bladder with
limited assessment on noncontrast imaging is under distended.

Bowel: Visualized bowel without acute abnormality. Partial
visualization of an anastomotic site in the right hemiabdomen.

Vascular/Lymphatic: No signs of pelvic adenopathy. Signs of calcific
atherosclerotic change throughout the iliac vasculature.

Reproductive: Post prostatectomy with surgical clips in the
prostatectomy bed.

Other:  Small fat containing left inguinal hernia.

Musculoskeletal: No signs of acute bone finding or evidence of
destructive bone process. Marked L5-S1 degenerative changes and pars
defects with grade 1 anterolisthesis of L5 on S1.
IMPRESSION: 1. Post prostatectomy with surgical clips in the prostatectomy bed.
No evidence of pelvic adenopathy.
2. Small fat containing left inguinal hernia.
3. Marked L5-S1 degenerative changes and pars defects with grade 1
anterolisthesis of L5 on S1.
4. Aortic atherosclerosis.

Aortic Atherosclerosis ([id number]).

## 2020-05-24 ENCOUNTER — Ambulatory Visit: Payer: Self-pay

## 2020-05-24 DIAGNOSIS — L237 Allergic contact dermatitis due to plants, except food: Secondary | ICD-10-CM | POA: Diagnosis not present

## 2020-05-24 NOTE — Telephone Encounter (Signed)
Pt. Was working in yard this week. Broke out in rash yesterday. Small blisters on face ,arms, and trunks. Very itchy. No availability in the practice today. Pt. Going to UC.  Reason for Disposition . [1] Severe poison ivy, oak, or sumac reaction in the past AND [2] face or genitals involved  Answer Assessment - Initial Assessment Questions 1. APPEARANCE of RASH: "Describe the rash."      Blisters 2. LOCATION: "Where is the rash located?"  (e.g., face, genitals, hands, legs)     Face, arms 3. SIZE: "How large is the rash?"      Small 4. ONSET: "When did the rash begin?"      Yesterday 5. ITCHING: "Does the rash itch?" If Yes, ask: "How bad is it?"   - MILD - doesn't interfere with normal activities   - MODERATE-SEVERE: interferes with work, school, sleep, or other activities      Moderate 6. EXPOSURE:  "How were you exposed to the plant (poison ivy, poison oak, sumac)"  "When were you exposed?"       Poison 7. PAST HISTORY: "Have you had a poison ivy rash before?" If Yes, ask: "How bad was it?"     Yes 8. PREGNANCY: "Is there any chance you are pregnant?" "When was your last menstrual period?"     n/a  Protocols used: POISON IVY - OAK - SUMAC-A-AH

## 2020-05-24 NOTE — Telephone Encounter (Signed)
FYI

## 2020-05-28 DIAGNOSIS — L448 Other specified papulosquamous disorders: Secondary | ICD-10-CM | POA: Diagnosis not present

## 2020-05-28 DIAGNOSIS — L538 Other specified erythematous conditions: Secondary | ICD-10-CM | POA: Diagnosis not present

## 2020-05-28 DIAGNOSIS — L237 Allergic contact dermatitis due to plants, except food: Secondary | ICD-10-CM | POA: Diagnosis not present

## 2020-05-29 ENCOUNTER — Other Ambulatory Visit: Payer: Self-pay | Admitting: Family Medicine

## 2020-05-29 DIAGNOSIS — E039 Hypothyroidism, unspecified: Secondary | ICD-10-CM

## 2020-05-29 NOTE — Telephone Encounter (Signed)
Requested medication (s) are due for refill today: yes  Requested medication (s) are on the active medication list: yes  Last refill:  02/28/2020  Future visit scheduled: yes  Notes to clinic: TSH needs to be rechecked within 3 months after an abnormal result. Refill until TSH is due Patient has appt on 07/03/2020   Requested Prescriptions  Pending Prescriptions Disp Refills   levothyroxine (SYNTHROID) 50 MCG tablet [Pharmacy Med Name: LEVOTHYROXINE SODIUM 50 MCG Tablet] 90 tablet 3    Sig: TAKE 1 TABLET BY MOUTH DAILY.      Endocrinology:  Hypothyroid Agents Failed - 05/29/2020  1:46 PM      Failed - TSH needs to be rechecked within 3 months after an abnormal result. Refill until TSH is due.      Passed - TSH in normal range and within 360 days    TSH  Date Value Ref Range Status  10/26/2019 4.210 0.450 - 4.500 uIU/mL Final          Passed - Valid encounter within last 12 months    Recent Outpatient Visits           4 months ago Encounter for [employer] annual wellness exam   [facility] Jerrol Banana., MD   7 months ago Nonintractable headache, unspecified chronicity pattern, unspecified headache type   [facility] Jerrol Banana., MD   1 year ago Essential (primary) hypertension   [facility] Jerrol Banana., MD   1 year ago Annual physical exam   [facility] Jerrol Banana., MD   1 year ago [place] Jerrol Banana., MD       Future Appointments             In 1 month Jerrol Banana., MD [facility] [place]

## 2020-06-25 DIAGNOSIS — H401132 Primary open-angle glaucoma, bilateral, moderate stage: Secondary | ICD-10-CM | POA: Diagnosis not present

## 2020-07-03 ENCOUNTER — Encounter: Payer: Self-pay | Admitting: Family Medicine

## 2020-07-03 ENCOUNTER — Ambulatory Visit (INDEPENDENT_AMBULATORY_CARE_PROVIDER_SITE_OTHER): Payer: Medicare HMO | Admitting: Family Medicine

## 2020-07-03 ENCOUNTER — Other Ambulatory Visit: Payer: Self-pay

## 2020-07-03 VITALS — BP 144/78 | HR 76 | Temp 98.5°F | Resp 16 | Ht 72.0 in | Wt 168.0 lb

## 2020-07-03 DIAGNOSIS — E7800 Pure hypercholesterolemia, unspecified: Secondary | ICD-10-CM | POA: Diagnosis not present

## 2020-07-03 DIAGNOSIS — R251 Tremor, unspecified: Secondary | ICD-10-CM

## 2020-07-03 DIAGNOSIS — I10 Essential (primary) hypertension: Secondary | ICD-10-CM

## 2020-07-03 DIAGNOSIS — E039 Hypothyroidism, unspecified: Secondary | ICD-10-CM | POA: Diagnosis not present

## 2020-07-03 NOTE — Progress Notes (Signed)
Established patient visit   Patient: Jeffrey Hendrix.   DOB: 1924-11-25   87 y.o. Male  MRN: [id number] Visit Date: 07/03/2020  Today's healthcare provider: Wilhemena Durie, MD   Chief Complaint  Patient presents with   Hypertension   Subjective    HPI  Patient comes in today for follow-up.  He has no complaints.  Blood pressures running 120s over 70s at home. Hypertension, follow-up  BP Readings from Last 3 Encounters:  07/03/20 (!) 144/78  01/01/20 (!) 171/78  10/25/19 (!) 174/82   Wt Readings from Last 3 Encounters:  07/03/20 168 lb (76.2 kg)  01/01/20 169 lb (76.7 kg)  10/25/19 168 lb (76.2 kg)     He was last seen for hypertension 6 months ago.  BP at that visit was 171/78. Management since that visit includes no medication changes.  He reports good compliance with treatment. He is not having side effects.  He is following a Regular diet. He is not exercising. He does not smoke.  Use of agents associated with hypertension: none.   Outside blood pressures are checked occasionally. Symptoms: No chest pain No chest pressure  No palpitations No syncope  No dyspnea No orthopnea  No paroxysmal nocturnal dyspnea No lower extremity edema   Pertinent labs: Lab Results  Component Value Date   CHOL 159 10/26/2019   HDL 49 10/26/2019   LDLCALC 87 10/26/2019   TRIG 129 10/26/2019   CHOLHDL 3.2 10/26/2019   Lab Results  Component Value Date   NA 139 10/26/2019   K 4.4 10/26/2019   CREATININE 1.17 10/26/2019   GFRNONAA 53 (L) 10/26/2019   GFRAA 62 10/26/2019   GLUCOSE 92 10/26/2019     The ASCVD Risk score (Goff DC Jr., et al., 2013) failed to calculate for the following reasons:   The 2013 ASCVD risk score is only valid for ages 39 to 82   Hypothyroid, follow-up  Lab Results  Component Value Date   TSH 4.210 10/26/2019   TSH 3.800 10/10/2018   TSH 3.300 12/22/2017   Wt Readings from Last 3 Encounters:  07/03/20 168 lb (76.2 kg)   01/01/20 169 lb (76.7 kg)  10/25/19 168 lb (76.2 kg)    He was last seen for hypothyroid 6 months ago.  Management since that visit includes no medication changes. He reports good compliance with treatment. He is not having side effects.   Symptoms: No change in energy level No constipation  No diarrhea No heat / cold intolerance  No nervousness No palpitations  No weight changes        Medications: Outpatient Medications Prior to Visit  Medication Sig   hydrochlorothiazide (HYDRODIURIL) 12.5 MG tablet TAKE 1/2 TABLET EVERY DAY   latanoprost (XALATAN) 0.005 % ophthalmic solution Place 1 drop into both eyes at bedtime.   levothyroxine (SYNTHROID) 50 MCG tablet TAKE 1 TABLET BY MOUTH DAILY.   propranolol (INDERAL) 10 MG tablet Take 1 tablet (10 mg total) by mouth 2 (two) times daily. (Patient not taking: No sig reported)   simvastatin (ZOCOR) 10 MG tablet TAKE 1 TABLET (10 MG TOTAL) BY MOUTH EVERY EVENING.   timolol (TIMOPTIC) 0.5 % ophthalmic solution Place 1 drop into both eyes 2 (two) times daily.   No facility-administered medications prior to visit.    Review of Systems  Constitutional:  Negative for appetite change and fatigue.  Respiratory:  Negative for cough and shortness of breath.   Cardiovascular:  Negative for  chest pain, palpitations and leg swelling.  Endocrine: Negative for cold intolerance, heat intolerance, polydipsia, polyphagia and polyuria.  Musculoskeletal:  Negative for arthralgias and myalgias.  Neurological:  Negative for dizziness, light-headedness and headaches.  Psychiatric/Behavioral:  Negative for self-injury, sleep disturbance and suicidal ideas. The patient is not nervous/anxious.        Objective    BP (!) 144/78   Pulse 76   Temp 98.5 F (36.9 C)   Resp 16   Ht 6' (1.829 m)   Wt 168 lb (76.2 kg)   BMI 22.78 kg/m  BP Readings from Last 3 Encounters:  07/03/20 (!) 144/78  01/01/20 (!) 171/78  10/25/19 (!) 174/82   Wt Readings  from Last 3 Encounters:  07/03/20 168 lb (76.2 kg)  01/01/20 169 lb (76.7 kg)  10/25/19 168 lb (76.2 kg)       Physical Exam Vitals reviewed.  Constitutional:      Appearance: He is well-developed.     Comments: Patient appears 2 years younger than his actual age.  HENT:     Head: Normocephalic and atraumatic.  Eyes:     General: No scleral icterus.    Conjunctiva/sclera: Conjunctivae normal.  Neck:     Thyroid: No thyromegaly.  Cardiovascular:     Rate and Rhythm: Normal rate and regular rhythm.     Heart sounds: Normal heart sounds.  Pulmonary:     Effort: Pulmonary effort is normal.     Breath sounds: Normal breath sounds.  Abdominal:     Palpations: Abdomen is soft.  Musculoskeletal:     Right lower leg: No edema.     Left lower leg: No edema.  Skin:    General: Skin is warm and dry.  Neurological:     General: No focal deficit present.     Mental Status: He is alert and oriented to person, place, and time.     Comments: No tremor noted today.  Psychiatric:        Mood and Affect: Mood normal.        Behavior: Behavior normal.        Thought Content: Thought content normal.        Judgment: Judgment normal.      No results found for any visits on 07/03/20.  Assessment & Plan     1. Essential (primary) hypertension Good control  2. Adult hypothyroidism Follow-up TSH when appropriate  3. Pure hypercholesterolemia Tolerating Zocor 10 for decades  4. Has a tremor Clinically stable   No follow-ups on file.      I, Wilhemena Durie, MD, have reviewed all documentation for this visit. The documentation on 07/08/20 for the exam, diagnosis, procedures, and orders are all accurate and complete.    Rethel Sebek Cranford Mon, MD  [facility] [phone] (phone) [phone] (fax)  [place]

## 2020-08-01 DIAGNOSIS — H26499 Other secondary cataract, unspecified eye: Secondary | ICD-10-CM | POA: Diagnosis not present

## 2020-08-01 DIAGNOSIS — H01009 Unspecified blepharitis unspecified eye, unspecified eyelid: Secondary | ICD-10-CM | POA: Diagnosis not present

## 2020-08-01 DIAGNOSIS — H02884 Meibomian gland dysfunction left upper eyelid: Secondary | ICD-10-CM | POA: Diagnosis not present

## 2020-08-01 DIAGNOSIS — H02889 Meibomian gland dysfunction of unspecified eye, unspecified eyelid: Secondary | ICD-10-CM | POA: Diagnosis not present

## 2020-08-01 DIAGNOSIS — D3132 Benign neoplasm of left choroid: Secondary | ICD-10-CM | POA: Diagnosis not present

## 2020-08-01 DIAGNOSIS — H04129 Dry eye syndrome of unspecified lacrimal gland: Secondary | ICD-10-CM | POA: Diagnosis not present

## 2020-08-01 DIAGNOSIS — H401131 Primary open-angle glaucoma, bilateral, mild stage: Secondary | ICD-10-CM | POA: Diagnosis not present

## 2020-08-01 DIAGNOSIS — Z0100 Encounter for examination of eyes and vision without abnormal findings: Secondary | ICD-10-CM | POA: Diagnosis not present

## 2020-09-16 DIAGNOSIS — H401133 Primary open-angle glaucoma, bilateral, severe stage: Secondary | ICD-10-CM | POA: Diagnosis not present

## 2020-09-30 DIAGNOSIS — L309 Dermatitis, unspecified: Secondary | ICD-10-CM | POA: Diagnosis not present

## 2020-09-30 DIAGNOSIS — D2262 Melanocytic nevi of left upper limb, including shoulder: Secondary | ICD-10-CM | POA: Diagnosis not present

## 2020-09-30 DIAGNOSIS — L570 Actinic keratosis: Secondary | ICD-10-CM | POA: Diagnosis not present

## 2020-09-30 DIAGNOSIS — D225 Melanocytic nevi of trunk: Secondary | ICD-10-CM | POA: Diagnosis not present

## 2020-09-30 DIAGNOSIS — D2261 Melanocytic nevi of right upper limb, including shoulder: Secondary | ICD-10-CM | POA: Diagnosis not present

## 2020-09-30 DIAGNOSIS — X32XXXA Exposure to sunlight, initial encounter: Secondary | ICD-10-CM | POA: Diagnosis not present

## 2020-10-04 ENCOUNTER — Other Ambulatory Visit: Payer: Self-pay | Admitting: Family Medicine

## 2020-10-22 ENCOUNTER — Other Ambulatory Visit: Payer: Self-pay | Admitting: Family Medicine

## 2020-10-22 DIAGNOSIS — E785 Hyperlipidemia, unspecified: Secondary | ICD-10-CM

## 2020-11-01 DIAGNOSIS — H401133 Primary open-angle glaucoma, bilateral, severe stage: Secondary | ICD-10-CM | POA: Diagnosis not present

## 2020-11-29 DIAGNOSIS — H401133 Primary open-angle glaucoma, bilateral, severe stage: Secondary | ICD-10-CM | POA: Diagnosis not present

## 2020-12-05 ENCOUNTER — Ambulatory Visit: Payer: Self-pay | Admitting: *Deleted

## 2020-12-05 NOTE — Telephone Encounter (Signed)
Per agent:  "Pt 's wife called saying husbands blood pressure was 155/76.  It has been going up a couple of days ago.   He would like an appt to see Dr. Rosanna Randy to check it and see if he needs to change his medications.  There are no appts currently.   CB#  [phone] "   Pt reports BP averaging 155/76 today, yesterday 141/70-80. States "That's about 20 points higher than usual for me". No missed doses of meds. Does report 2 episodes of left sided chest pain today after lunch. States dull "Size of orange" over left breast duration 2-4 minutes. No associated symptoms. "It's from this high blood pressure." PAin did not radiate. Evasive historian, states "Moderate." ALso concerned because he has to cut BP meds with a knife "Sometines they are not all the same size, that may have something to do with my pressure being high." Denies any SOB, no nausea, no sweating, no CP presently.  Assured pt NT would route to PCP for review and final disposition. Advised ED if chest pain returns. Pt asks "Even if it goes away?"  Advised if CP returns, any SOB, nausea, sweating, go to ED. Pt verbalizes understanding.  Please advise regarding appt within protocol timeframe of 24 hours.    Reason for Disposition  [5] Systolic BP  >= 681 OR Diastolic >= 80 AND [2] taking BP medications  Answer Assessment - Initial Assessment Questions 1. BLOOD PRESSURE: "What is the blood pressure?" "Did you take at least two measurements 5 minutes apart?"     155/76 2. ONSET: "When did you take your blood pressure?"     today 3. HOW: "How did you obtain the blood pressure?" (e.g., visiting nurse, automatic home BP monitor)     Home monitor 4. HISTORY: "Do you have a history of high blood pressure?"     Yes 5. MEDICATIONS: "Are you taking any medications for blood pressure?" "Have you missed any doses recently?"     Yes. No missed doses 6. OTHER SYMPTOMS: "Do you have any symptoms?" (e.g., headache, chest pain, blurred vision,  difficulty breathing, weakness) Dull chest pain x 2 today lasted 2-4 minutes "Small area, orange size."  Protocols used: Blood Pressure - High-A-AH

## 2020-12-09 ENCOUNTER — Other Ambulatory Visit: Payer: Self-pay

## 2020-12-09 ENCOUNTER — Encounter: Payer: Self-pay | Admitting: Family Medicine

## 2020-12-09 ENCOUNTER — Ambulatory Visit (INDEPENDENT_AMBULATORY_CARE_PROVIDER_SITE_OTHER): Payer: Medicare HMO | Admitting: Family Medicine

## 2020-12-09 VITALS — BP 175/84 | HR 78 | Temp 98.3°F | Wt 165.3 lb

## 2020-12-09 DIAGNOSIS — R251 Tremor, unspecified: Secondary | ICD-10-CM

## 2020-12-09 DIAGNOSIS — K2100 Gastro-esophageal reflux disease with esophagitis, without bleeding: Secondary | ICD-10-CM

## 2020-12-09 DIAGNOSIS — E7800 Pure hypercholesterolemia, unspecified: Secondary | ICD-10-CM

## 2020-12-09 DIAGNOSIS — E039 Hypothyroidism, unspecified: Secondary | ICD-10-CM

## 2020-12-09 DIAGNOSIS — R079 Chest pain, unspecified: Secondary | ICD-10-CM | POA: Diagnosis not present

## 2020-12-09 DIAGNOSIS — I10 Essential (primary) hypertension: Secondary | ICD-10-CM

## 2020-12-09 NOTE — Patient Instructions (Addendum)
INCREASE HYDROCHLOROTHIAZIDE TO ONE WHOLE TABLET 12.5 MG DAILY.

## 2020-12-09 NOTE — Progress Notes (Signed)
Established patient visit   Patient: Jeffrey Hendrix.   DOB: [birth year]   87 y.o. Male  MRN: [id number] Visit Date: 12/09/2020  Today's healthcare provider: Wilhemena Durie, MD   No chief complaint on file.  Subjective    HPI  Very young appearing 87 year old comes in today for follow-up.  He is alert and oriented.  He is always been very anxious about health issues.  He is polite and cooperative. He states that recently his blood pressure has been running up he occasionally feels a couple of minutes of left-sided chest discomfort no other symptoms in which is not exertional and goes away in a minute or 2.  He also has occasional reflux symptoms, again not exertional.  These are very vague.  When he is up and walking and moving about he has no problems at all.  No syncope or presyncope.  He is taking one half of an HCT 12.5 and is very reluctant to do much other than make very small dosage adjustments. Hypertension, follow-up  BP Readings from Last 3 Encounters:  07/03/20 (!) 144/78  01/01/20 (!) 171/78  10/25/19 (!) 174/82   Wt Readings from Last 3 Encounters:  07/03/20 168 lb (76.2 kg)  01/01/20 169 lb (76.7 kg)  10/25/19 168 lb (76.2 kg)     He was last seen for hypertension 5 months ago.  BP at that visit was 144/78. Management since that visit includes; good control. He reports good compliance with treatment. He is not having side effects.  He is exercising. He is adherent to low salt diet.   Outside blood pressures are .  He does not smoke.  Use of agents associated with hypertension: none.   ---------------------------------------------------------------------------------------------------     Medications: Outpatient Medications Prior to Visit  Medication Sig   hydrochlorothiazide (HYDRODIURIL) 12.5 MG tablet TAKE 1/2 TABLET EVERY DAY   latanoprost (XALATAN) 0.005 % ophthalmic solution Place 1 drop into both eyes at bedtime.   levothyroxine  (SYNTHROID) 50 MCG tablet TAKE 1 TABLET BY MOUTH DAILY.   propranolol (INDERAL) 10 MG tablet Take 1 tablet (10 mg total) by mouth 2 (two) times daily. (Patient not taking: No sig reported)   simvastatin (ZOCOR) 10 MG tablet TAKE 1 TABLET (10 MG TOTAL) BY MOUTH EVERY EVENING.   timolol (TIMOPTIC) 0.5 % ophthalmic solution Place 1 drop into both eyes 2 (two) times daily.   No facility-administered medications prior to visit.    Review of Systems  Constitutional:  Negative for appetite change, chills and fever.  Respiratory:  Negative for chest tightness, shortness of breath and wheezing.   Cardiovascular:  Negative for chest pain and palpitations.  Gastrointestinal:  Negative for abdominal pain, nausea and vomiting.       Objective    There were no vitals taken for this visit. BP Readings from Last 3 Encounters:  12/09/20 (!) 175/84  07/03/20 (!) 144/78  01/01/20 (!) 171/78   Wt Readings from Last 3 Encounters:  12/09/20 165 lb 4.8 oz (75 kg)  07/03/20 168 lb (76.2 kg)  01/01/20 169 lb (76.7 kg)      Physical Exam Vitals reviewed.  Constitutional:      Appearance: He is well-developed.     Comments: Patient appears 68 years younger than his actual age.  HENT:     Head: Normocephalic and atraumatic.  Eyes:     General: No scleral icterus.    Conjunctiva/sclera: Conjunctivae normal.  Neck:  Thyroid: No thyromegaly.  Cardiovascular:     Rate and Rhythm: Normal rate and regular rhythm.     Heart sounds: Normal heart sounds.  Pulmonary:     Effort: Pulmonary effort is normal.     Breath sounds: Normal breath sounds.  Abdominal:     Palpations: Abdomen is soft.  Musculoskeletal:     Right lower leg: No edema.     Left lower leg: No edema.  Skin:    General: Skin is warm and dry.  Neurological:     General: No focal deficit present.     Mental Status: He is alert and oriented to person, place, and time.     Comments: No tremor noted today.  Psychiatric:         Mood and Affect: Mood normal.        Behavior: Behavior normal.        Thought Content: Thought content normal.        Judgment: Judgment normal.    ECG reveals normal sinus rhythm rate of about 75 with no ischemic changes.  No results found for any visits on 12/09/20.  Assessment & Plan     1. Essential (primary) hypertension Small increases in systolic blood pressure recently.  Considered adding amlodipine but patient would rather just increase HCTZ to 12.5 mg every morning.  I will have him follow-up here in the office in the next month or so. - Lipid panel - TSH - CBC w/Diff/Platelet - Comprehensive Metabolic Panel (CMET)  2. Chest pain, unspecified type Very nonspecific and it really appears noncardiac.  If he develops other symptoms will refer to cardiology but at this time I will just treat his blood pressure. Patient to let us know if he develops other symptoms and to report to ED if needed. - EKG 12-Lead - Lipid panel - TSH - CBC w/Diff/Platelet - Comprehensive Metabolic Panel (CMET)  3. Adult hypothyroidism  - Lipid panel - TSH - CBC w/Diff/Platelet - Comprehensive Metabolic Panel (CMET)  4. Pure hypercholesterolemia  - Lipid panel - TSH - CBC w/Diff/Platelet - Comprehensive Metabolic Panel (CMET) 5.  GERD Considered omeprazole but he has had possible reaction in the past. If he continues with some reflux symptoms would use famotidine  No follow-ups on file.      I, Wilhemena Durie, MD, have reviewed all documentation for this visit. The documentation on 12/10/20 for the exam, diagnosis, procedures, and orders are all accurate and complete.    Kaenan Jake Cranford Mon, MD  [facility] [phone] (phone) [phone] (fax)  [place]

## 2020-12-10 DIAGNOSIS — I10 Essential (primary) hypertension: Secondary | ICD-10-CM | POA: Diagnosis not present

## 2020-12-11 LAB — LIPID PANEL
Chol/HDL Ratio: 3 ratio (ref 0.0–5.0)
Cholesterol, Total: 166 mg/dL (ref 100–199)
HDL: 55 mg/dL (ref >39)
LDL Chol Calc (NIH): 91 mg/dL (ref 0–99)
Triglycerides: 109 mg/dL (ref 0–149)
VLDL Cholesterol Cal: 20 mg/dL (ref 5–40)

## 2020-12-11 LAB — COMPREHENSIVE METABOLIC PANEL
ALT: 14 IU/L (ref 0–44)
AST: 19 IU/L (ref 0–40)
Albumin/Globulin Ratio: 2 (ref 1.2–2.2)
Albumin: 4.3 g/dL (ref 3.5–4.6)
Alkaline Phosphatase: 80 IU/L (ref 44–121)
BUN/Creatinine Ratio: 13 (ref 10–24)
BUN: 16 mg/dL (ref 10–36)
Bilirubin Total: 0.6 mg/dL (ref 0.0–1.2)
CO2: 26 mmol/L (ref 20–29)
Calcium: 9.3 mg/dL (ref 8.6–10.2)
Chloride: 100 mmol/L (ref 96–106)
Creatinine, Ser: 1.25 mg/dL (ref 0.76–1.27)
Globulin, Total: 2.2 g/dL (ref 1.5–4.5)
Glucose: 105 mg/dL — ABNORMAL HIGH (ref 70–99)
Potassium: 4.3 mmol/L (ref 3.5–5.2)
Sodium: 139 mmol/L (ref 134–144)
Total Protein: 6.5 g/dL (ref 6.0–8.5)
eGFR: 53 mL/min/{1.73_m2} — ABNORMAL LOW (ref >59)

## 2020-12-11 LAB — CBC WITH DIFFERENTIAL/PLATELET
Basophils Absolute: 0.1 10*3/uL (ref 0.0–0.2)
Basos: 1 %
EOS (ABSOLUTE): 0.3 10*3/uL (ref 0.0–0.4)
Eos: 4 %
Hematocrit: 49.9 % (ref 37.5–51.0)
Hemoglobin: 16.5 g/dL (ref 13.0–17.7)
Immature Grans (Abs): 0 10*3/uL (ref 0.0–0.1)
Immature Granulocytes: 0 %
Lymphocytes Absolute: 2.2 10*3/uL (ref 0.7–3.1)
Lymphs: 37 %
MCH: 30.3 pg (ref 26.6–33.0)
MCHC: 33.1 g/dL (ref 31.5–35.7)
MCV: 92 fL (ref 79–97)
Monocytes Absolute: 0.6 10*3/uL (ref 0.1–0.9)
Monocytes: 10 %
Neutrophils Absolute: 2.8 10*3/uL (ref 1.4–7.0)
Neutrophils: 48 %
Platelets: 302 10*3/uL (ref 150–450)
RBC: 5.45 x10E6/uL (ref 4.14–5.80)
RDW: 12.6 % (ref 11.6–15.4)
WBC: 5.9 10*3/uL (ref 3.4–10.8)

## 2020-12-11 LAB — TSH: TSH: 5.65 u[IU]/mL — ABNORMAL HIGH (ref 0.450–4.500)

## 2020-12-16 ENCOUNTER — Telehealth: Payer: Self-pay

## 2020-12-16 NOTE — Telephone Encounter (Signed)
Please advise lab results from 12/10/2020?

## 2020-12-16 NOTE — Telephone Encounter (Signed)
Copied from [place] [id number]. Topic: Quick Communication - Lab Results (Clinic Use ONLY) >> Dec 16, 2020  9:13 AM Rayann Heman wrote: Pt wife called and would like a call back regarding lab results. Please advice.

## 2020-12-16 NOTE — Telephone Encounter (Signed)
Patient was advised of results.  

## 2020-12-17 ENCOUNTER — Telehealth: Payer: Self-pay | Admitting: Family Medicine

## 2020-12-17 NOTE — Telephone Encounter (Signed)
Copied from [place] [id number]. Topic: Quick Communication - Rx Refill/Question >> Dec 17, 2020  3:59 PM Yvette Rack wrote: Pt requests a new Rx for 90 day supply of the following:  Medication: hydrochlorothiazide (HYDRODIURIL) 12.5 MG tablet  Has the patient contacted their pharmacy? Yes.  Pt advised to contact provider (Agent: If no, request that the patient contact the pharmacy for the refill. If patient does not wish to contact the pharmacy document the reason why and proceed with request.) (Agent: If yes, when and what did the pharmacy advise?)  Preferred Pharmacy (with phone number or street name): [place], [place] Phone: [phone]   Fax: [phone]  Has the patient been seen for an appointment in the last year OR does the patient have an upcoming appointment? Yes.    Agent: Please be advised that RX refills may take up to 3 business days. We ask that you follow-up with your pharmacy.

## 2020-12-18 ENCOUNTER — Telehealth: Payer: Self-pay | Admitting: Family Medicine

## 2020-12-18 MED ORDER — HYDROCHLOROTHIAZIDE 12.5 MG PO TABS: 6.2500 mg | ORAL_TABLET | Freq: Every day | ORAL | 3 refills | Status: DC

## 2020-12-18 NOTE — Telephone Encounter (Signed)
[facility] faxed refill request for the following medications:   hydrochlorothiazide (HYDRODIURIL) 12.5 MG tablet   Please advise.

## 2020-12-18 NOTE — Telephone Encounter (Signed)
Rx sent to pharmacy   

## 2020-12-26 DIAGNOSIS — K219 Gastro-esophageal reflux disease without esophagitis: Secondary | ICD-10-CM | POA: Diagnosis not present

## 2020-12-26 DIAGNOSIS — R9431 Abnormal electrocardiogram [ECG] [EKG]: Secondary | ICD-10-CM | POA: Diagnosis not present

## 2020-12-26 DIAGNOSIS — I10 Essential (primary) hypertension: Secondary | ICD-10-CM | POA: Diagnosis not present

## 2020-12-26 DIAGNOSIS — R0602 Shortness of breath: Secondary | ICD-10-CM | POA: Diagnosis not present

## 2020-12-26 DIAGNOSIS — I208 Other forms of angina pectoris: Secondary | ICD-10-CM | POA: Diagnosis not present

## 2020-12-26 DIAGNOSIS — R079 Chest pain, unspecified: Secondary | ICD-10-CM | POA: Diagnosis not present

## 2020-12-26 DIAGNOSIS — I959 Hypotension, unspecified: Secondary | ICD-10-CM | POA: Diagnosis not present

## 2021-01-06 ENCOUNTER — Encounter: Payer: Self-pay | Admitting: Family Medicine

## 2021-01-08 ENCOUNTER — Ambulatory Visit: Payer: Medicare HMO | Admitting: Physician Assistant

## 2021-01-17 ENCOUNTER — Telehealth: Payer: Self-pay | Admitting: Family Medicine

## 2021-01-17 ENCOUNTER — Ambulatory Visit: Payer: Self-pay

## 2021-01-17 MED ORDER — HYDROCHLOROTHIAZIDE 12.5 MG PO TABS: 12.5000 mg | ORAL_TABLET | Freq: Every day | ORAL | 3 refills | Status: DC

## 2021-01-17 NOTE — Telephone Encounter (Signed)
Patient requesting a "NEW" script for a 90 day supply of hydrochlorothiazide (HYDRODIURIL). Patient states at last office visit Dr. Rosanna Randy and him discussed changing the frequency from; Take 0.5 tablets (6.25 mg total) by mouth daily to 1 tablet daily. Patient states this should have been sent in when he received his blood work in November.  Patient would like this new script sent to [facility].     [place], [place] Phone:  [phone]  Fax:  [phone]

## 2021-02-20 DIAGNOSIS — I208 Other forms of angina pectoris: Secondary | ICD-10-CM | POA: Diagnosis not present

## 2021-02-20 DIAGNOSIS — R0602 Shortness of breath: Secondary | ICD-10-CM | POA: Diagnosis not present

## 2021-03-10 DIAGNOSIS — M9901 Segmental and somatic dysfunction of cervical region: Secondary | ICD-10-CM | POA: Diagnosis not present

## 2021-03-10 DIAGNOSIS — M5412 Radiculopathy, cervical region: Secondary | ICD-10-CM | POA: Diagnosis not present

## 2021-03-11 DIAGNOSIS — K219 Gastro-esophageal reflux disease without esophagitis: Secondary | ICD-10-CM | POA: Diagnosis not present

## 2021-03-11 DIAGNOSIS — R079 Chest pain, unspecified: Secondary | ICD-10-CM | POA: Diagnosis not present

## 2021-03-11 DIAGNOSIS — I10 Essential (primary) hypertension: Secondary | ICD-10-CM | POA: Diagnosis not present

## 2021-03-11 DIAGNOSIS — I208 Other forms of angina pectoris: Secondary | ICD-10-CM | POA: Diagnosis not present

## 2021-03-11 DIAGNOSIS — R9431 Abnormal electrocardiogram [ECG] [EKG]: Secondary | ICD-10-CM | POA: Diagnosis not present

## 2021-03-11 DIAGNOSIS — I959 Hypotension, unspecified: Secondary | ICD-10-CM | POA: Diagnosis not present

## 2021-03-11 DIAGNOSIS — R0602 Shortness of breath: Secondary | ICD-10-CM | POA: Diagnosis not present

## 2021-03-14 DIAGNOSIS — M5412 Radiculopathy, cervical region: Secondary | ICD-10-CM | POA: Diagnosis not present

## 2021-03-14 DIAGNOSIS — M9901 Segmental and somatic dysfunction of cervical region: Secondary | ICD-10-CM | POA: Diagnosis not present

## 2021-03-17 DIAGNOSIS — M9901 Segmental and somatic dysfunction of cervical region: Secondary | ICD-10-CM | POA: Diagnosis not present

## 2021-03-17 DIAGNOSIS — M5412 Radiculopathy, cervical region: Secondary | ICD-10-CM | POA: Diagnosis not present

## 2021-03-21 DIAGNOSIS — M5412 Radiculopathy, cervical region: Secondary | ICD-10-CM | POA: Diagnosis not present

## 2021-03-21 DIAGNOSIS — M9901 Segmental and somatic dysfunction of cervical region: Secondary | ICD-10-CM | POA: Diagnosis not present

## 2021-03-24 DIAGNOSIS — M5412 Radiculopathy, cervical region: Secondary | ICD-10-CM | POA: Diagnosis not present

## 2021-03-24 DIAGNOSIS — M9901 Segmental and somatic dysfunction of cervical region: Secondary | ICD-10-CM | POA: Diagnosis not present

## 2021-03-28 DIAGNOSIS — M9901 Segmental and somatic dysfunction of cervical region: Secondary | ICD-10-CM | POA: Diagnosis not present

## 2021-03-28 DIAGNOSIS — M5412 Radiculopathy, cervical region: Secondary | ICD-10-CM | POA: Diagnosis not present

## 2021-03-31 DIAGNOSIS — H401133 Primary open-angle glaucoma, bilateral, severe stage: Secondary | ICD-10-CM | POA: Diagnosis not present

## 2021-06-02 DIAGNOSIS — L570 Actinic keratosis: Secondary | ICD-10-CM | POA: Diagnosis not present

## 2021-06-02 DIAGNOSIS — D2261 Melanocytic nevi of right upper limb, including shoulder: Secondary | ICD-10-CM | POA: Diagnosis not present

## 2021-06-02 DIAGNOSIS — D225 Melanocytic nevi of trunk: Secondary | ICD-10-CM | POA: Diagnosis not present

## 2021-06-02 DIAGNOSIS — D2262 Melanocytic nevi of left upper limb, including shoulder: Secondary | ICD-10-CM | POA: Diagnosis not present

## 2021-06-02 DIAGNOSIS — L821 Other seborrheic keratosis: Secondary | ICD-10-CM | POA: Diagnosis not present

## 2021-06-02 DIAGNOSIS — Z85828 Personal history of other malignant neoplasm of skin: Secondary | ICD-10-CM | POA: Diagnosis not present

## 2021-06-25 DIAGNOSIS — I10 Essential (primary) hypertension: Secondary | ICD-10-CM | POA: Diagnosis not present

## 2021-06-25 DIAGNOSIS — I959 Hypotension, unspecified: Secondary | ICD-10-CM | POA: Diagnosis not present

## 2021-06-25 DIAGNOSIS — R9431 Abnormal electrocardiogram [ECG] [EKG]: Secondary | ICD-10-CM | POA: Diagnosis not present

## 2021-06-25 DIAGNOSIS — I208 Other forms of angina pectoris: Secondary | ICD-10-CM | POA: Diagnosis not present

## 2021-06-25 DIAGNOSIS — R079 Chest pain, unspecified: Secondary | ICD-10-CM | POA: Diagnosis not present

## 2021-06-25 DIAGNOSIS — R0602 Shortness of breath: Secondary | ICD-10-CM | POA: Diagnosis not present

## 2021-06-25 DIAGNOSIS — K219 Gastro-esophageal reflux disease without esophagitis: Secondary | ICD-10-CM | POA: Diagnosis not present

## 2021-07-07 NOTE — Progress Notes (Unsigned)
      Established patient visit  I,Myrl Bynum,acting as a scribe for Wilhemena Durie, MD.,have documented all relevant documentation on the behalf of Wilhemena Durie, MD,as directed by  Wilhemena Durie, MD while in the presence of Wilhemena Durie, MD.   Patient: Jeffrey Hendrix Isabell Jarvis.   DOB: 1924/06/19   88 y.o. Male  MRN: [id number] Visit Date: 07/08/2021  Today's healthcare provider: Wilhemena Durie, MD   Chief Complaint  Patient presents with   Follow-up   Hypertension   Subjective    HPI  Hypertension, follow-up  BP Readings from Last 3 Encounters:  07/08/21 112/80  12/09/20 (!) 175/84  07/03/20 (!) 144/78   Wt Readings from Last 3 Encounters:  07/08/21 165 lb (74.8 kg)  12/09/20 165 lb 4.8 oz (75 kg)  07/03/20 168 lb (76.2 kg)     He was last seen for hypertension 6 months ago.  BP at that visit was as above. Considered adding Amlodipine but patient preferred to increase HCTZ to 12.5 mg daily.  Patient was asked to follow up in a month or so after that visit but cancelled that appointment.  Outside blood pressures are 115/67.  Pertinent labs Lab Results  Component Value Date   CHOL 166 12/10/2020   HDL 55 12/10/2020   LDLCALC 91 12/10/2020   TRIG 109 12/10/2020   CHOLHDL 3.0 12/10/2020   Lab Results  Component Value Date   NA 139 12/10/2020   K 4.3 12/10/2020   CREATININE 1.25 12/10/2020   EGFR 53 (L) 12/10/2020   GLUCOSE 105 (H) 12/10/2020   TSH 5.650 (H) 12/10/2020     The ASCVD Risk score (Arnett DK, et al., 2019) failed to calculate for the following reasons:   The 2019 ASCVD risk score is only valid for ages 23 to 46  ---------------------------------------------------------------------------------------------------   Medications: Outpatient Medications Prior to Visit  Medication Sig   hydrochlorothiazide (HYDRODIURIL) 12.5 MG tablet Take 1 tablet (12.5 mg total) by mouth daily.   latanoprost (XALATAN) 0.005 % ophthalmic  solution Place 1 drop into both eyes at bedtime.   levothyroxine (SYNTHROID) 50 MCG tablet TAKE 1 TABLET BY MOUTH DAILY.   simvastatin (ZOCOR) 10 MG tablet TAKE 1 TABLET (10 MG TOTAL) BY MOUTH EVERY EVENING.   timolol (TIMOPTIC) 0.5 % ophthalmic solution Place 1 drop into both eyes 2 (two) times daily.   [DISCONTINUED] propranolol (INDERAL) 10 MG tablet Take 1 tablet (10 mg total) by mouth 2 (two) times daily. (Patient not taking: Reported on 04/17/2019)   No facility-administered medications prior to visit.    Review of Systems  {Labs  Heme  Chem  Endocrine  Serology  Results Review (optional):23779}   Objective    BP 112/80 (BP Location: Right Arm, Patient Position: Sitting, Cuff Size: Normal)   Pulse 74   Resp 16   Wt 165 lb (74.8 kg)   SpO2 98%   BMI 22.38 kg/m  {Show previous vital signs (optional):23777}  Physical Exam  ***  No results found for any visits on 07/08/21.  Assessment & Plan     1. Essential (primary) hypertension   Return in about 6 months (around 01/07/2022).      {provider attestation***:1}   Wilhemena Durie, MD  [facility] [phone] (phone) [phone] (fax)  [place]

## 2021-07-08 ENCOUNTER — Encounter: Payer: Self-pay | Admitting: Family Medicine

## 2021-07-08 ENCOUNTER — Ambulatory Visit (INDEPENDENT_AMBULATORY_CARE_PROVIDER_SITE_OTHER): Payer: Medicare HMO | Admitting: Family Medicine

## 2021-07-08 VITALS — BP 112/80 | HR 74 | Resp 16 | Wt 165.0 lb

## 2021-07-08 DIAGNOSIS — E039 Hypothyroidism, unspecified: Secondary | ICD-10-CM | POA: Diagnosis not present

## 2021-07-08 DIAGNOSIS — I10 Essential (primary) hypertension: Secondary | ICD-10-CM

## 2021-07-08 DIAGNOSIS — Z8546 Personal history of malignant neoplasm of prostate: Secondary | ICD-10-CM | POA: Diagnosis not present

## 2021-07-08 DIAGNOSIS — H409 Unspecified glaucoma: Secondary | ICD-10-CM | POA: Diagnosis not present

## 2021-07-08 DIAGNOSIS — E7800 Pure hypercholesterolemia, unspecified: Secondary | ICD-10-CM

## 2021-07-30 ENCOUNTER — Other Ambulatory Visit: Payer: Self-pay | Admitting: Family Medicine

## 2021-07-30 DIAGNOSIS — E039 Hypothyroidism, unspecified: Secondary | ICD-10-CM

## 2021-08-18 ENCOUNTER — Other Ambulatory Visit: Payer: Self-pay | Admitting: Family Medicine

## 2021-08-18 DIAGNOSIS — E785 Hyperlipidemia, unspecified: Secondary | ICD-10-CM

## 2021-08-19 NOTE — Telephone Encounter (Signed)
Requested Prescriptions  Pending Prescriptions Disp Refills  . simvastatin (ZOCOR) 10 MG tablet [Pharmacy Med Name: SIMVASTATIN 10 MG Tablet] 90 tablet 1    Sig: TAKE 1 TABLET (10 MG TOTAL) EVERY EVENING.     Cardiovascular:  Antilipid - Statins Failed - 08/18/2021 11:06 AM      Failed - Lipid Panel in normal range within the last 12 months    Cholesterol, Total  Date Value Ref Range Status  12/10/2020 166 100 - 199 mg/dL Final   LDL Cholesterol (Calc)  Date Value Ref Range Status  12/15/2016 83 mg/dL (calc) Final    Comment:    Reference range: <100 . Desirable range <100 mg/dL for primary prevention;   <70 mg/dL for patients with CHD or diabetic patients  with > or = 2 CHD risk factors. Marland Kitchen LDL-C is now calculated using the Martin-Hopkins  calculation, which is a validated novel method providing  better accuracy than the Friedewald equation in the  estimation of LDL-C.  Cresenciano Genre et al. Annamaria Helling. 6629;476(54): 2061-2068  (http://education.[employer].com/faq/FAQ164)    LDL Chol Calc (NIH)  Date Value Ref Range Status  12/10/2020 91 0 - 99 mg/dL Final   HDL  Date Value Ref Range Status  12/10/2020 55 >39 mg/dL Final   Triglycerides  Date Value Ref Range Status  12/10/2020 109 0 - 149 mg/dL Final         Passed - Patient is not pregnant      Passed - Valid encounter within last 12 months    Recent Outpatient Visits          1 month ago Essential (primary) hypertension   [facility] Jerrol Banana., MD   8 months ago Essential (primary) hypertension   [facility] Jerrol Banana., MD   1 year ago Essential (primary) hypertension   [facility] Jerrol Banana., MD   1 year ago Encounter for Medicare annual wellness exam   [facility] Jerrol Banana., MD   1 year ago Nonintractable headache, unspecified chronicity pattern, unspecified headache type   [facility]  Jerrol Banana., MD      Future Appointments            In 4 months Jerrol Banana., MD [facility] [place]

## 2021-09-02 DIAGNOSIS — D2271 Melanocytic nevi of right lower limb, including hip: Secondary | ICD-10-CM | POA: Diagnosis not present

## 2021-09-02 DIAGNOSIS — Z0000 Encounter for general adult medical examination without abnormal findings: Secondary | ICD-10-CM | POA: Diagnosis not present

## 2021-09-02 DIAGNOSIS — D2262 Melanocytic nevi of left upper limb, including shoulder: Secondary | ICD-10-CM | POA: Diagnosis not present

## 2021-09-02 DIAGNOSIS — L821 Other seborrheic keratosis: Secondary | ICD-10-CM | POA: Diagnosis not present

## 2021-09-02 DIAGNOSIS — L570 Actinic keratosis: Secondary | ICD-10-CM | POA: Diagnosis not present

## 2021-09-02 DIAGNOSIS — D2272 Melanocytic nevi of left lower limb, including hip: Secondary | ICD-10-CM | POA: Diagnosis not present

## 2021-09-02 DIAGNOSIS — D2261 Melanocytic nevi of right upper limb, including shoulder: Secondary | ICD-10-CM | POA: Diagnosis not present

## 2021-09-02 DIAGNOSIS — D225 Melanocytic nevi of trunk: Secondary | ICD-10-CM | POA: Diagnosis not present

## 2021-09-12 DIAGNOSIS — H401133 Primary open-angle glaucoma, bilateral, severe stage: Secondary | ICD-10-CM | POA: Diagnosis not present

## 2021-09-12 DIAGNOSIS — H40003 Preglaucoma, unspecified, bilateral: Secondary | ICD-10-CM | POA: Diagnosis not present

## 2021-09-12 DIAGNOSIS — Z0100 Encounter for examination of eyes and vision without abnormal findings: Secondary | ICD-10-CM | POA: Diagnosis not present

## 2021-11-24 ENCOUNTER — Other Ambulatory Visit: Payer: Self-pay | Admitting: Family Medicine

## 2022-01-12 NOTE — Progress Notes (Signed)
I,Joseline E Rosas,acting as a scribe for [employer], MD.,have documented all relevant documentation on the behalf of Ronnald Ramp, MD,as directed by  Ronnald Ramp, MD while in the presence of Ronnald Ramp, MD.   Annual Wellness Visit     Patient: Jeffrey Strycker., Male    DOB: [birth year], 88 y.o.   MRN: [id number] Visit Date: 01/13/2022  Today's Provider: Ronnald Ramp, MD   Chief Complaint  Patient presents with   Annual Exam   Subjective    Jeffrey Cramp Ra Knabe. is a 88 y.o. male who presents today for his Annual Wellness Visit.  He reports consuming a general diet. The patient does not participate in regular exercise at present. He generally feels well. He reports sleeping well. He does not have additional problems to discuss today.   Reports previously had influenza   HPI  BP's readings at home are 94's/122/50's-60's 94/52 127/63 106/55 122/58 122/65 114/59  Reorts he had some ham over the weekend   Medications: Outpatient Medications Prior to Visit  Medication Sig   hydrochlorothiazide (HYDRODIURIL) 12.5 MG tablet TAKE 1 TABLET EVERY DAY   latanoprost (XALATAN) 0.005 % ophthalmic solution Place 1 drop into both eyes at bedtime.   levothyroxine (SYNTHROID) 50 MCG tablet TAKE 1 TABLET EVERY DAY   simvastatin (ZOCOR) 10 MG tablet TAKE 1 TABLET (10 MG TOTAL) EVERY EVENING.   timolol (TIMOPTIC) 0.5 % ophthalmic solution Place 1 drop into both eyes 2 (two) times daily.   No facility-administered medications prior to visit.    Allergies  Allergen Reactions   Doxycycline    Latex    Omeprazole Swelling   Ranitidine     Developed bad heartburn and stomach pain   Sulfa Antibiotics    Benadryl [Diphenhydramine]     Feels "uneasy"   Pce  [Erythromycin] Rash    Patient Care Team: Ronnald Ramp, MD as PCP - General (Family Medicine) Lockie Mola, MD as Referring  Physician (Ophthalmology) Dasher, Cliffton Asters, MD (Dermatology)  Review of Systems  Neurological:  Positive for tremors.  All other systems reviewed and are negative.       Objective    Vitals: BP (!) 157/91 (BP Location: Left Arm, Patient Position: Sitting, Cuff Size: Normal)   Pulse 72   Temp 97.7 F (36.5 C) (Oral)   Resp 16   Ht 6' (1.829 m)   Wt 162 lb 8 oz (73.7 kg)   BMI 22.04 kg/m     Physical Exam Vitals reviewed.  Constitutional:      General: He is not in acute distress.    Appearance: Normal appearance. He is not ill-appearing, toxic-appearing or diaphoretic.  HENT:     Head: Normocephalic and atraumatic.     Right Ear: Tympanic membrane and external ear normal.     Left Ear: Tympanic membrane and external ear normal.     Nose: Nose normal.     Mouth/Throat:     Mouth: Mucous membranes are moist.     Pharynx: No oropharyngeal exudate or posterior oropharyngeal erythema.  Eyes:     General: No scleral icterus.    Extraocular Movements: Extraocular movements intact.     Conjunctiva/sclera: Conjunctivae normal.     Pupils: Pupils are equal, round, and reactive to light.  Neck:     Vascular: No carotid bruit.  Cardiovascular:     Rate and Rhythm: Normal rate and regular rhythm.     Pulses: Normal pulses.     Heart sounds:  Normal heart sounds. No murmur heard.    No friction rub. No gallop.  Pulmonary:     Effort: Pulmonary effort is normal. No respiratory distress.     Breath sounds: Normal breath sounds. No stridor. No wheezing, rhonchi or rales.  Abdominal:     General: Bowel sounds are normal. There is no distension.     Palpations: Abdomen is soft.     Tenderness: There is no abdominal tenderness.  Musculoskeletal:        General: No swelling, tenderness or signs of injury. Normal range of motion.     Cervical back: Normal range of motion and neck supple. No rigidity or tenderness.     Right lower leg: No edema.     Left lower leg: No edema.   Lymphadenopathy:     Cervical: No cervical adenopathy.  Skin:    General: Skin is warm and dry.     Capillary Refill: Capillary refill takes less than 2 seconds.     Findings: No erythema or rash.  Neurological:     Mental Status: He is alert and oriented to person, place, and time.     Motor: No weakness.     Gait: Gait normal.  Psychiatric:        Attention and Perception: Attention normal.        Mood and Affect: Mood normal.        Speech: Speech normal.        Behavior: Behavior normal. Behavior is cooperative.        Thought Content: Thought content normal.     Most recent functional status assessment:    01/13/2022   10:40 AM  In your present state of health, do you have any difficulty performing the following activities:  Hearing? 0  Vision? 1  Difficulty concentrating or making decisions? 0  Walking or climbing stairs? 0  Dressing or bathing? 0  Doing errands, shopping? 0   Most recent fall risk assessment:    01/13/2022   10:39 AM  Fall Risk   Falls in the past year? 0  Number falls in past yr: 0  Injury with Fall? 0  Risk for fall due to : No Fall Risks    Most recent depression screenings:    01/13/2022   10:39 AM 01/01/2020   10:35 AM  PHQ 2/9 Scores  PHQ - 2 Score 0 0  PHQ- 9 Score 0 0   Most recent cognitive screening:     No data to display         Most recent Audit-C alcohol use screening    01/13/2022   10:40 AM  Alcohol Use Disorder Test (AUDIT)  1. How often do you have a drink containing alcohol? 0  2. How many drinks containing alcohol do you have on a typical day when you are drinking? 0  3. How often do you have six or more drinks on one occasion? 0  AUDIT-C Score 0   A score of 3 or more in women, and 4 or more in men indicates increased risk for alcohol abuse, EXCEPT if all of the points are from question 1   No results found for any visits on 01/13/22.  Assessment & Plan     Annual wellness visit done today  including the all of the following: Reviewed patient's Family Medical History Reviewed and updated list of patient's medical providers Assessment of cognitive impairment was done Assessed patient's functional ability Established a written schedule for health  screening services Health Risk Assessent Completed and Reviewed  Exercise Activities and Dietary recommendations  Goals      DIET - INCREASE WATER INTAKE     Recommend to drink at least 6-8 8oz glasses of water per day.        Immunization History  Administered Date(s) Administered   Fluad Quad(high Dose 65+) 10/10/2018, 01/01/2020   Influenza, High Dose Seasonal PF 11/26/2015, 10/28/2016   Influenza-Unspecified 11/23/2017   Pneumococcal Polysaccharide-23 01/25/1992   Td 01/22/2010   Tdap 08/02/2012   Zoster, Live 01/16/2013    Health Maintenance  Topic Date Due   Pneumonia Vaccine 69+ Years old (2 - PCV) 01/24/1993   INFLUENZA VACCINE  08/19/2021   COVID-19 Vaccine (1) 01/29/2022 (Originally 03/22/1925)   Zoster Vaccines- Shingrix (1 of 2) 04/14/2022 (Originally 09/21/1976)   DTaP/Tdap/Td (3 - Td or Tdap) 08/03/2022   Medicare Annual Wellness (AWV)  01/14/2023   HPV VACCINES  Aged Out     Discussed health benefits of physical activity, and encouraged him to engage in regular exercise appropriate for his age and condition.    Problem List Items Addressed This Visit       Cardiovascular and Mediastinum   Essential (primary) hypertension    Not currently at goal  Home BP recordings appear to be mostly within normal range  Hesitant to add additional BP lowering medications given age and risk of hypotension  Will have him follow up in 1 month as I suspect elevations are related to holiday diet (ex ham)        Relevant Orders   Comprehensive metabolic panel     Endocrine   Adult hypothyroidism    Adherent to regimen of  synthroid daily  Will check TSH and free T4 today       Relevant Orders   TSH      Other   HLD (hyperlipidemia)    Stable  Adherent to simvastatin 10mg  daily  Will check lipid panel today       Relevant Orders   Lipid panel   Medicare annual wellness visit, subsequent - Primary   Other Visit Diagnoses     Encounter for annual physical exam       Weight loss       Relevant Orders   CBC        Return in about 1 month (around 02/13/2022) for HTN.     I, Ronnald Ramp, MD, have reviewed all documentation for this visit.  Portions of this information were initially documented by the CMA and reviewed by me for thoroughness and accuracy.      Ronnald Ramp, MD  [facility] [phone] (phone) [phone] (fax)  [facility]

## 2022-01-13 ENCOUNTER — Encounter: Payer: Medicare HMO | Admitting: Family Medicine

## 2022-01-13 ENCOUNTER — Ambulatory Visit: Payer: Medicare HMO | Admitting: Family Medicine

## 2022-01-13 ENCOUNTER — Ambulatory Visit (INDEPENDENT_AMBULATORY_CARE_PROVIDER_SITE_OTHER): Payer: Medicare HMO | Admitting: Family Medicine

## 2022-01-13 ENCOUNTER — Encounter: Payer: Self-pay | Admitting: Family Medicine

## 2022-01-13 VITALS — BP 157/91 | HR 72 | Temp 97.7°F | Resp 16 | Ht 72.0 in | Wt 162.5 lb

## 2022-01-13 DIAGNOSIS — R634 Abnormal weight loss: Secondary | ICD-10-CM

## 2022-01-13 DIAGNOSIS — I10 Essential (primary) hypertension: Secondary | ICD-10-CM | POA: Diagnosis not present

## 2022-01-13 DIAGNOSIS — Z0000 Encounter for general adult medical examination without abnormal findings: Secondary | ICD-10-CM | POA: Diagnosis not present

## 2022-01-13 DIAGNOSIS — E7800 Pure hypercholesterolemia, unspecified: Secondary | ICD-10-CM | POA: Diagnosis not present

## 2022-01-13 DIAGNOSIS — E785 Hyperlipidemia, unspecified: Secondary | ICD-10-CM | POA: Diagnosis not present

## 2022-01-13 DIAGNOSIS — E039 Hypothyroidism, unspecified: Secondary | ICD-10-CM | POA: Diagnosis not present

## 2022-01-13 NOTE — Assessment & Plan Note (Addendum)
Not currently at goal  Home BP recordings appear to be mostly within normal range  Hesitant to add additional BP lowering medications given age and risk of hypotension  Will have him follow up in 1 month as I suspect elevations are related to holiday diet (ex ham)

## 2022-01-13 NOTE — Patient Instructions (Signed)
Health Maintenance After Age 86 After age 86, you are at a higher risk for certain long-term diseases and infections as well as injuries from falls. Falls are a major cause of broken bones and head injuries in people who are older than age 86. Getting regular preventive care can help to keep you healthy and well. Preventive care includes getting regular testing and making lifestyle changes as recommended by your health care provider. Talk with your health care provider about: Which screenings and tests you should have. A screening is a test that checks for a disease when you have no symptoms. A diet and exercise plan that is right for you. What should I know about screenings and tests to prevent falls? Screening and testing are the best ways to find a health problem early. Early diagnosis and treatment give you the best chance of managing medical conditions that are common after age 86. Certain conditions and lifestyle choices may make you more likely to have a fall. Your health care provider may recommend: Regular vision checks. Poor vision and conditions such as cataracts can make you more likely to have a fall. If you wear glasses, make sure to get your prescription updated if your vision changes. Medicine review. Work with your health care provider to regularly review all of the medicines you are taking, including over-the-counter medicines. Ask your health care provider about any side effects that may make you more likely to have a fall. Tell your health care provider if any medicines that you take make you feel dizzy or sleepy. Strength and balance checks. Your health care provider may recommend certain tests to check your strength and balance while standing, walking, or changing positions. Foot health exam. Foot pain and numbness, as well as not wearing proper footwear, can make you more likely to have a fall. Screenings, including: Osteoporosis screening. Osteoporosis is a condition that causes  the bones to get weaker and break more easily. Blood pressure screening. Blood pressure changes and medicines to control blood pressure can make you feel dizzy. Depression screening. You may be more likely to have a fall if you have a fear of falling, feel depressed, or feel unable to do activities that you used to do. Alcohol use screening. Using too much alcohol can affect your balance and may make you more likely to have a fall. Follow these instructions at home: Lifestyle Do not drink alcohol if: Your health care provider tells you not to drink. If you drink alcohol: Limit how much you have to: 0-1 drink a day for women. 0-2 drinks a day for men. Know how much alcohol is in your drink. In the [place] one drink equals one 12 oz bottle of beer (355 mL), one 5 oz glass of wine (148 mL), or one 1 oz glass of hard liquor (44 mL). Do not use any products that contain nicotine or tobacco. These products include cigarettes, chewing tobacco, and vaping devices, such as e-cigarettes. If you need help quitting, ask your health care provider. Activity  Follow a regular exercise program to stay fit. This will help you maintain your balance. Ask your health care provider what types of exercise are appropriate for you. If you need a cane or walker, use it as recommended by your health care provider. Wear supportive shoes that have nonskid soles. Safety  Remove any tripping hazards, such as rugs, cords, and clutter. Install safety equipment such as grab bars in bathrooms and safety rails on stairs. Keep rooms and walkways   well-lit. General instructions Talk with your health care provider about your risks for falling. Tell your health care provider if: You fall. Be sure to tell your health care provider about all falls, even ones that seem minor. You feel dizzy, tiredness (fatigue), or off-balance. Take over-the-counter and prescription medicines only as told by your health care provider. These include  supplements. Eat a healthy diet and maintain a healthy weight. A healthy diet includes low-fat dairy products, low-fat (lean) meats, and fiber from whole grains, beans, and lots of fruits and vegetables. Stay current with your vaccines. Schedule regular health, dental, and eye exams. Summary Having a healthy lifestyle and getting preventive care can help to protect your health and wellness after age 86. Screening and testing are the best way to find a health problem early and help you avoid having a fall. Early diagnosis and treatment give you the best chance for managing medical conditions that are more common for people who are older than age 86. Falls are a major cause of broken bones and head injuries in people who are older than age 86. Take precautions to prevent a fall at home. Work with your health care provider to learn what changes you can make to improve your health and wellness and to prevent falls. This information is not intended to replace advice given to you by your health care provider. Make sure you discuss any questions you have with your health care provider. Document Revised: 05/27/2020 Document Reviewed: 05/27/2020 Elsevier Patient Education  2023 [employer]  

## 2022-01-13 NOTE — Assessment & Plan Note (Signed)
Stable  Adherent to simvastatin '10mg'$  daily  Will check lipid panel today

## 2022-01-13 NOTE — Assessment & Plan Note (Signed)
Adherent to regimen of  synthroid 68mg daily  Will check TSH and free T4 today

## 2022-01-14 LAB — COMPREHENSIVE METABOLIC PANEL
ALT: 13 IU/L (ref 0–44)
AST: 17 IU/L (ref 0–40)
Albumin/Globulin Ratio: 1.5 (ref 1.2–2.2)
Albumin: 4.1 g/dL (ref 3.6–4.6)
Alkaline Phosphatase: 82 IU/L (ref 44–121)
BUN/Creatinine Ratio: 18 (ref 10–24)
BUN: 21 mg/dL (ref 10–36)
Bilirubin Total: 0.7 mg/dL (ref 0.0–1.2)
CO2: 23 mmol/L (ref 20–29)
Calcium: 9.1 mg/dL (ref 8.6–10.2)
Chloride: 102 mmol/L (ref 96–106)
Creatinine, Ser: 1.19 mg/dL (ref 0.76–1.27)
Globulin, Total: 2.7 g/dL (ref 1.5–4.5)
Glucose: 107 mg/dL — ABNORMAL HIGH (ref 70–99)
Potassium: 4.2 mmol/L (ref 3.5–5.2)
Sodium: 140 mmol/L (ref 134–144)
Total Protein: 6.8 g/dL (ref 6.0–8.5)
eGFR: 56 mL/min/{1.73_m2} — ABNORMAL LOW (ref >59)

## 2022-01-14 LAB — CBC
Hematocrit: 45.5 % (ref 37.5–51.0)
Hemoglobin: 15.8 g/dL (ref 13.0–17.7)
MCH: 31.4 pg (ref 26.6–33.0)
MCHC: 34.7 g/dL (ref 31.5–35.7)
MCV: 91 fL (ref 79–97)
Platelets: 270 10*3/uL (ref 150–450)
RBC: 5.03 x10E6/uL (ref 4.14–5.80)
RDW: 12.3 % (ref 11.6–15.4)
WBC: 5.3 10*3/uL (ref 3.4–10.8)

## 2022-01-14 LAB — LIPID PANEL
Chol/HDL Ratio: 3.3 ratio (ref 0.0–5.0)
Cholesterol, Total: 174 mg/dL (ref 100–199)
HDL: 52 mg/dL (ref >39)
LDL Chol Calc (NIH): 98 mg/dL (ref 0–99)
Triglycerides: 134 mg/dL (ref 0–149)
VLDL Cholesterol Cal: 24 mg/dL (ref 5–40)

## 2022-01-14 LAB — TSH: TSH: 2.87 u[IU]/mL (ref 0.450–4.500)

## 2022-01-15 DIAGNOSIS — Z961 Presence of intraocular lens: Secondary | ICD-10-CM | POA: Diagnosis not present

## 2022-02-09 ENCOUNTER — Other Ambulatory Visit: Payer: Self-pay | Admitting: Family Medicine

## 2022-02-10 NOTE — Telephone Encounter (Signed)
Requested Prescriptions  Pending Prescriptions Disp Refills   hydrochlorothiazide (HYDRODIURIL) 12.5 MG tablet [Pharmacy Med Name: HYDROCHLOROTHIAZIDE 12.5 MG Tablet] 90 tablet 0    Sig: TAKE 1 TABLET EVERY DAY     Cardiovascular: Diuretics - Thiazide Failed - 02/09/2022 10:36 AM      Failed - Last BP in normal range    BP Readings from Last 1 Encounters:  01/13/22 (!) 157/91         Passed - Cr in normal range and within 180 days    Creat  Date Value Ref Range Status  12/15/2016 1.19 (H) 0.70 - 1.11 mg/dL Final    Comment:    For patients >31 years of age, the reference limit for Creatinine is approximately 13% higher for people identified as African-American. .    Creatinine, Ser  Date Value Ref Range Status  01/13/2022 1.19 0.76 - 1.27 mg/dL Final         Passed - K in normal range and within 180 days    Potassium  Date Value Ref Range Status  01/13/2022 4.2 3.5 - 5.2 mmol/L Final  04/25/2014 4.0 mmol/L Final    Comment:    3.5-5.1 NOTE: New Reference Range  03/27/14          Passed - Na in normal range and within 180 days    Sodium  Date Value Ref Range Status  01/13/2022 140 134 - 144 mmol/L Final  04/25/2014 138 mmol/L Final    Comment:    135-145 NOTE: New Reference Range  03/27/14          Passed - Valid encounter within last 6 months    Recent Outpatient Visits           4 weeks ago Medicare annual wellness visit, subsequent   [facility] Simmons-Robinson, [place], MD   7 months ago Essential (primary) hypertension   [place] Jerrol Banana., MD   1 year ago Essential (primary) hypertension   [place] Jerrol Banana., MD   1 year ago Essential (primary) hypertension   [place] Jerrol Banana., MD   2 years ago Encounter for [employer] annual wellness exam   [facility] Jerrol Banana., MD       Future Appointments             In 5 months Simmons-Robinson, Riki Sheer, MD [facility]

## 2022-03-18 ENCOUNTER — Telehealth: Payer: Self-pay | Admitting: Family Medicine

## 2022-03-18 DIAGNOSIS — E039 Hypothyroidism, unspecified: Secondary | ICD-10-CM

## 2022-03-18 MED ORDER — LEVOTHYROXINE SODIUM 50 MCG PO TABS: 50.0000 ug | ORAL_TABLET | Freq: Every day | ORAL | 1 refills | Status: AC

## 2022-03-18 NOTE — Telephone Encounter (Signed)
[place] faxed refill request for the following medications:   levothyroxine (SYNTHROID) 50 MCG tablet   Please advise.

## 2022-03-18 NOTE — Telephone Encounter (Signed)
Rx sent #[id number]

## 2022-04-02 ENCOUNTER — Other Ambulatory Visit: Payer: Self-pay | Admitting: Family Medicine

## 2022-04-02 DIAGNOSIS — E785 Hyperlipidemia, unspecified: Secondary | ICD-10-CM

## 2022-04-02 MED ORDER — SIMVASTATIN 10 MG PO TABS: ORAL_TABLET | ORAL | 1 refills | Status: AC

## 2022-04-16 DIAGNOSIS — Z8546 Personal history of malignant neoplasm of prostate: Secondary | ICD-10-CM | POA: Diagnosis not present

## 2022-04-16 DIAGNOSIS — E039 Hypothyroidism, unspecified: Secondary | ICD-10-CM | POA: Diagnosis not present

## 2022-04-16 DIAGNOSIS — E7800 Pure hypercholesterolemia, unspecified: Secondary | ICD-10-CM | POA: Diagnosis not present

## 2022-04-16 DIAGNOSIS — I10 Essential (primary) hypertension: Secondary | ICD-10-CM | POA: Diagnosis not present

## 2022-04-24 ENCOUNTER — Other Ambulatory Visit: Payer: Self-pay | Admitting: Family Medicine

## 2022-05-15 DIAGNOSIS — H353131 Nonexudative age-related macular degeneration, bilateral, early dry stage: Secondary | ICD-10-CM | POA: Diagnosis not present

## 2022-05-15 DIAGNOSIS — Z961 Presence of intraocular lens: Secondary | ICD-10-CM | POA: Diagnosis not present

## 2022-05-15 DIAGNOSIS — H401133 Primary open-angle glaucoma, bilateral, severe stage: Secondary | ICD-10-CM | POA: Diagnosis not present

## 2022-05-25 DIAGNOSIS — H40003 Preglaucoma, unspecified, bilateral: Secondary | ICD-10-CM | POA: Diagnosis not present

## 2022-05-28 DIAGNOSIS — H401133 Primary open-angle glaucoma, bilateral, severe stage: Secondary | ICD-10-CM | POA: Diagnosis not present

## 2022-06-04 DIAGNOSIS — Z85828 Personal history of other malignant neoplasm of skin: Secondary | ICD-10-CM | POA: Diagnosis not present

## 2022-06-04 DIAGNOSIS — D3614 Benign neoplasm of peripheral nerves and autonomic nervous system of thorax: Secondary | ICD-10-CM | POA: Diagnosis not present

## 2022-06-04 DIAGNOSIS — D225 Melanocytic nevi of trunk: Secondary | ICD-10-CM | POA: Diagnosis not present

## 2022-06-04 DIAGNOSIS — L821 Other seborrheic keratosis: Secondary | ICD-10-CM | POA: Diagnosis not present

## 2022-06-04 DIAGNOSIS — Z08 Encounter for follow-up examination after completed treatment for malignant neoplasm: Secondary | ICD-10-CM | POA: Diagnosis not present

## 2022-06-04 DIAGNOSIS — L570 Actinic keratosis: Secondary | ICD-10-CM | POA: Diagnosis not present

## 2022-06-24 DIAGNOSIS — I10 Essential (primary) hypertension: Secondary | ICD-10-CM | POA: Diagnosis not present

## 2022-06-24 DIAGNOSIS — E039 Hypothyroidism, unspecified: Secondary | ICD-10-CM | POA: Diagnosis not present

## 2022-06-24 DIAGNOSIS — E785 Hyperlipidemia, unspecified: Secondary | ICD-10-CM | POA: Diagnosis not present

## 2022-06-24 DIAGNOSIS — I959 Hypotension, unspecified: Secondary | ICD-10-CM | POA: Diagnosis not present

## 2022-07-15 ENCOUNTER — Ambulatory Visit: Payer: Medicare HMO | Admitting: Family Medicine

## 2022-07-31 DIAGNOSIS — H401133 Primary open-angle glaucoma, bilateral, severe stage: Secondary | ICD-10-CM | POA: Diagnosis not present

## 2022-09-02 ENCOUNTER — Telehealth: Payer: Self-pay | Admitting: Family Medicine

## 2022-09-02 NOTE — Telephone Encounter (Signed)
Patient's seeing Dr.[facility]. See office note in Epic.

## 2022-10-05 ENCOUNTER — Other Ambulatory Visit: Payer: Self-pay | Admitting: Family Medicine

## 2022-10-05 DIAGNOSIS — E785 Hyperlipidemia, unspecified: Secondary | ICD-10-CM

## 2022-11-04 DIAGNOSIS — I10 Essential (primary) hypertension: Secondary | ICD-10-CM | POA: Diagnosis not present

## 2022-11-04 DIAGNOSIS — E7800 Pure hypercholesterolemia, unspecified: Secondary | ICD-10-CM | POA: Diagnosis not present

## 2022-11-04 DIAGNOSIS — H409 Unspecified glaucoma: Secondary | ICD-10-CM | POA: Diagnosis not present

## 2022-11-04 DIAGNOSIS — E039 Hypothyroidism, unspecified: Secondary | ICD-10-CM | POA: Diagnosis not present

## 2022-12-01 DIAGNOSIS — H401133 Primary open-angle glaucoma, bilateral, severe stage: Secondary | ICD-10-CM | POA: Diagnosis not present

## 2022-12-08 DIAGNOSIS — H353131 Nonexudative age-related macular degeneration, bilateral, early dry stage: Secondary | ICD-10-CM | POA: Diagnosis not present

## 2022-12-08 DIAGNOSIS — H401133 Primary open-angle glaucoma, bilateral, severe stage: Secondary | ICD-10-CM | POA: Diagnosis not present

## 2022-12-08 DIAGNOSIS — Z961 Presence of intraocular lens: Secondary | ICD-10-CM | POA: Diagnosis not present

## 2023-04-01 DIAGNOSIS — Z08 Encounter for follow-up examination after completed treatment for malignant neoplasm: Secondary | ICD-10-CM | POA: Diagnosis not present

## 2023-04-01 DIAGNOSIS — D225 Melanocytic nevi of trunk: Secondary | ICD-10-CM | POA: Diagnosis not present

## 2023-04-01 DIAGNOSIS — L821 Other seborrheic keratosis: Secondary | ICD-10-CM | POA: Diagnosis not present

## 2023-04-01 DIAGNOSIS — D485 Neoplasm of uncertain behavior of skin: Secondary | ICD-10-CM | POA: Diagnosis not present

## 2023-04-01 DIAGNOSIS — Z85828 Personal history of other malignant neoplasm of skin: Secondary | ICD-10-CM | POA: Diagnosis not present

## 2023-04-01 DIAGNOSIS — L570 Actinic keratosis: Secondary | ICD-10-CM | POA: Diagnosis not present

## 2023-04-01 DIAGNOSIS — L409 Psoriasis, unspecified: Secondary | ICD-10-CM | POA: Diagnosis not present

## 2023-04-01 DIAGNOSIS — L111 Transient acantholytic dermatosis [Grover]: Secondary | ICD-10-CM | POA: Diagnosis not present

## 2023-04-09 DIAGNOSIS — H401133 Primary open-angle glaucoma, bilateral, severe stage: Secondary | ICD-10-CM | POA: Diagnosis not present

## 2023-04-09 DIAGNOSIS — Z0100 Encounter for examination of eyes and vision without abnormal findings: Secondary | ICD-10-CM | POA: Diagnosis not present

## 2023-04-09 DIAGNOSIS — H353131 Nonexudative age-related macular degeneration, bilateral, early dry stage: Secondary | ICD-10-CM | POA: Diagnosis not present

## 2023-05-06 DIAGNOSIS — Z0000 Encounter for general adult medical examination without abnormal findings: Secondary | ICD-10-CM | POA: Diagnosis not present

## 2023-06-01 DIAGNOSIS — D485 Neoplasm of uncertain behavior of skin: Secondary | ICD-10-CM | POA: Diagnosis not present

## 2023-06-01 DIAGNOSIS — L570 Actinic keratosis: Secondary | ICD-10-CM | POA: Diagnosis not present

## 2023-06-01 DIAGNOSIS — D0439 Carcinoma in situ of skin of other parts of face: Secondary | ICD-10-CM | POA: Diagnosis not present

## 2023-06-23 DIAGNOSIS — I10 Essential (primary) hypertension: Secondary | ICD-10-CM | POA: Diagnosis not present

## 2023-06-23 DIAGNOSIS — E039 Hypothyroidism, unspecified: Secondary | ICD-10-CM | POA: Diagnosis not present

## 2023-06-23 DIAGNOSIS — E785 Hyperlipidemia, unspecified: Secondary | ICD-10-CM | POA: Diagnosis not present

## 2023-06-23 DIAGNOSIS — I959 Hypotension, unspecified: Secondary | ICD-10-CM | POA: Diagnosis not present

## 2023-07-20 DIAGNOSIS — L905 Scar conditions and fibrosis of skin: Secondary | ICD-10-CM | POA: Diagnosis not present

## 2023-07-20 DIAGNOSIS — C44329 Squamous cell carcinoma of skin of other parts of face: Secondary | ICD-10-CM | POA: Diagnosis not present

## 2023-08-10 DIAGNOSIS — Z961 Presence of intraocular lens: Secondary | ICD-10-CM | POA: Diagnosis not present

## 2023-08-10 DIAGNOSIS — H353131 Nonexudative age-related macular degeneration, bilateral, early dry stage: Secondary | ICD-10-CM | POA: Diagnosis not present

## 2023-08-10 DIAGNOSIS — H401133 Primary open-angle glaucoma, bilateral, severe stage: Secondary | ICD-10-CM | POA: Diagnosis not present

## 2023-10-12 DIAGNOSIS — L570 Actinic keratosis: Secondary | ICD-10-CM | POA: Diagnosis not present

## 2023-10-12 DIAGNOSIS — L309 Dermatitis, unspecified: Secondary | ICD-10-CM | POA: Diagnosis not present

## 2023-11-19 DIAGNOSIS — H409 Unspecified glaucoma: Secondary | ICD-10-CM | POA: Diagnosis not present

## 2023-11-19 DIAGNOSIS — E7800 Pure hypercholesterolemia, unspecified: Secondary | ICD-10-CM | POA: Diagnosis not present
